# Patient Record
Sex: Male | Born: 1970
Health system: Southern US, Community
[De-identification: ages and names within clinical notes are randomized; demographics above are authoritative.]

## PROBLEM LIST (undated history)

## (undated) DIAGNOSIS — I10 Essential (primary) hypertension: Secondary | ICD-10-CM

## (undated) DIAGNOSIS — E669 Obesity, unspecified: Secondary | ICD-10-CM

## (undated) HISTORY — DX: Obesity, unspecified: E66.9

## (undated) HISTORY — PX: TONSILLECTOMY: SUR1361

## (undated) HISTORY — DX: Essential (primary) hypertension: I10

## (undated) HISTORY — PX: VASECTOMY: SHX75

## (undated) HISTORY — PX: ADENOIDECTOMY: SUR15

---

## 2002-10-16 ENCOUNTER — Encounter: Payer: Self-pay | Admitting: *Deleted

## 2002-10-16 ENCOUNTER — Encounter: Admission: RE | Admit: 2002-10-16 | Discharge: 2002-10-16 | Payer: Self-pay | Admitting: *Deleted

## 2005-12-12 ENCOUNTER — Emergency Department (HOSPITAL_COMMUNITY): Admission: EM | Admit: 2005-12-12 | Discharge: 2005-12-12 | Payer: Self-pay | Admitting: Family Medicine

## 2007-01-21 ENCOUNTER — Emergency Department (HOSPITAL_COMMUNITY): Admission: EM | Admit: 2007-01-21 | Discharge: 2007-01-21 | Payer: Self-pay | Admitting: Family Medicine

## 2007-02-18 ENCOUNTER — Encounter: Payer: Self-pay | Admitting: Family Medicine

## 2007-02-18 LAB — CONVERTED CEMR LAB
Cholesterol: 215 mg/dL
HDL: 47 mg/dL
LDL Cholesterol: 137 mg/dL
Triglycerides: 153 mg/dL

## 2007-02-24 ENCOUNTER — Ambulatory Visit: Payer: Self-pay | Admitting: Family Medicine

## 2007-02-24 DIAGNOSIS — I8390 Asymptomatic varicose veins of unspecified lower extremity: Secondary | ICD-10-CM

## 2007-02-24 DIAGNOSIS — I10 Essential (primary) hypertension: Secondary | ICD-10-CM

## 2007-04-24 ENCOUNTER — Encounter: Payer: Self-pay | Admitting: Family Medicine

## 2007-04-24 ENCOUNTER — Telehealth (INDEPENDENT_AMBULATORY_CARE_PROVIDER_SITE_OTHER): Payer: Self-pay | Admitting: *Deleted

## 2007-04-24 LAB — CONVERTED CEMR LAB
BUN: 14 mg/dL (ref 6–23)
CO2: 25 meq/L (ref 19–32)
Calcium: 9.5 mg/dL (ref 8.4–10.5)
Chloride: 103 meq/L (ref 96–112)
Creatinine, Ser: 1.23 mg/dL (ref 0.40–1.50)
Glucose, Bld: 89 mg/dL (ref 70–99)
Potassium: 4.9 meq/L (ref 3.5–5.3)
Sodium: 140 meq/L (ref 135–145)

## 2008-09-06 ENCOUNTER — Ambulatory Visit: Payer: Self-pay | Admitting: Family Medicine

## 2008-11-10 ENCOUNTER — Ambulatory Visit: Payer: Self-pay | Admitting: Family Medicine

## 2008-11-10 DIAGNOSIS — H9209 Otalgia, unspecified ear: Secondary | ICD-10-CM | POA: Insufficient documentation

## 2008-11-10 DIAGNOSIS — J069 Acute upper respiratory infection, unspecified: Secondary | ICD-10-CM | POA: Insufficient documentation

## 2009-05-18 ENCOUNTER — Telehealth (INDEPENDENT_AMBULATORY_CARE_PROVIDER_SITE_OTHER): Payer: Self-pay | Admitting: *Deleted

## 2009-08-29 ENCOUNTER — Telehealth (INDEPENDENT_AMBULATORY_CARE_PROVIDER_SITE_OTHER): Payer: Self-pay | Admitting: *Deleted

## 2010-03-16 ENCOUNTER — Ambulatory Visit: Payer: Self-pay | Admitting: Family Medicine

## 2010-05-19 ENCOUNTER — Encounter: Payer: Self-pay | Admitting: Family Medicine

## 2010-05-22 ENCOUNTER — Ambulatory Visit: Payer: Self-pay | Admitting: Family Medicine

## 2010-05-22 LAB — CONVERTED CEMR LAB
ALT: 21 units/L (ref 0–53)
AST: 18 units/L (ref 0–37)
Albumin: 4.3 g/dL (ref 3.5–5.2)
Alkaline Phosphatase: 66 units/L (ref 39–117)
BUN: 16 mg/dL (ref 6–23)
CO2: 25 meq/L (ref 19–32)
Calcium: 9.4 mg/dL (ref 8.4–10.5)
Chloride: 102 meq/L (ref 96–112)
Cholesterol: 205 mg/dL — ABNORMAL HIGH (ref 0–200)
Creatinine, Ser: 1.25 mg/dL (ref 0.40–1.50)
Glucose, Bld: 91 mg/dL (ref 70–99)
HDL: 55 mg/dL (ref >39)
LDL Cholesterol: 125 mg/dL — ABNORMAL HIGH (ref 0–99)
Potassium: 4.4 meq/L (ref 3.5–5.3)
Sodium: 138 meq/L (ref 135–145)
Total Bilirubin: 0.4 mg/dL (ref 0.3–1.2)
Total CHOL/HDL Ratio: 3.7
Total Protein: 7.3 g/dL (ref 6.0–8.3)
Triglycerides: 125 mg/dL (ref <150)
VLDL: 25 mg/dL (ref 0–40)

## 2010-06-15 ENCOUNTER — Telehealth: Payer: Self-pay | Admitting: Family Medicine

## 2010-10-30 ENCOUNTER — Ambulatory Visit: Payer: Self-pay | Admitting: Family Medicine

## 2010-12-26 NOTE — Assessment & Plan Note (Signed)
Summary: CPE   Vital Signs:  Patient profile:   40 year old male Height:      69.75 inches Weight:      216 pounds BMI:     31.33 O2 Sat:      99 % on Room air Pulse rate:   62 / minute BP sitting:   131 / 78  (left arm) Cuff size:   large  Vitals Entered By: Payton Spark CMA (October 30, 2010 8:47 AM)  O2 Flow:  Room air CC: CPE   Primary Care Provider:  Seymour Bars DO  CC:  CPE.  History of Present Illness: 40 yo WM presents for CPE.  He had fasting labs in June 2011.  Jogging for exercise but just not on a regular basis.  His tetanus was updated in 2006.  Denies fam hx of colon cancer and is unsure of a fam hx for prostate cancer.  Denies any rectal bleeding or problems voiding.  He had a flu shot at work.  Married with 2 kids.  Denies fam hx of premature heart dz.  He take as MVI daily, wears compression hose for varicose veins at work.   Current Medications (verified): 1)  Multivitamins  Tabs (Multiple Vitamin) .... Take 1 Tablet By Mouth Once A Day 2)  Ranitidine Hcl 150 Mg Caps (Ranitidine Hcl) .Marland Kitchen.. 1 Tab By Mouth Two Times A Day As Needed Heartburn 3)  Benazepril-Hydrochlorothiazide 10-12.5 Mg Tabs (Benazepril-Hydrochlorothiazide) .Marland Kitchen.. 1 Tab By Mouth Qam  Allergies (verified): No Known Drug Allergies  Past History:  Past Medical History: Reviewed history from 05/22/2010 and no changes required. HTN obesity  Past Surgical History: Reviewed history from 09/06/2008 and no changes required. adenoidectomy vasectomy  Family History: Reviewed history from 02/24/2007 and no changes required. father high chol, HTN, ETOH, depression mother depression, ETOH PGF AMI at 71, died MGF, prostate or bladder CA  Social History: Reviewed history from 02/24/2007 and no changes required. 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  Married to [place].  Has 2 young kids.  Nonsmoker.  1-2 ETOH drinks per day.  2 cups coffee daily.  Trying to lose wt.  Fair diet, cardio and wts 3 x a wk.  Review  of Systems  The patient denies anorexia, fever, weight loss, weight gain, vision loss, decreased hearing, hoarseness, chest pain, syncope, dyspnea on exertion, peripheral edema, prolonged cough, headaches, hemoptysis, abdominal pain, melena, hematochezia, severe indigestion/heartburn, hematuria, incontinence, genital sores, muscle weakness, suspicious skin lesions, transient blindness, difficulty walking, depression, unusual weight change, abnormal bleeding, enlarged lymph nodes, angioedema, breast masses, and testicular masses.    Physical Exam  General:  alert, well-developed, well-nourished, well-hydrated, and overweight-appearing.   Head:  normocephalic, atraumatic, and male-pattern balding.   Eyes:  pupils equal, pupils round, and pupils reactive to light.   Ears:  no external deformities.   Nose:  no nasal discharge.   Mouth:  good dentition and pharynx pink and moist.   Neck:  no masses.   Lungs:  Normal respiratory effort, chest expands symmetrically. Lungs are clear to auscultation, no crackles or wheezes. Heart:  Normal rate and regular rhythm. S1 and S2 normal without gallop, murmur, click, rub or other extra sounds. Abdomen:  Bowel sounds positive,abdomen soft and non-tender without masses, organomegaly, no AA bruits, NABS Pulses:  2+ radial and pedal pulses Extremities:  no UE or LE edema small proximal L thumbnail hematoma mild LE varicose veins Neurologic:  gait normal.   Skin:  color normal.  Cervical Nodes:  No lymphadenopathy noted Psych:  good eye contact, not anxious appearing, and not depressed appearing.     Impression & Recommendations:  Problem # 1:  HEALTH MAINTENANCE EXAM (ICD-V70.0) Keeping healthy checklist for men reviewed. BP OK. Tetanus updated in 2006. Labs done 05-19-10, normal. Flu shot done at work. Work on [employer], regular exercise, wt loss, MVI daily. RTC in 6 mos.  Complete Medication List: 1)  Multivitamins Tabs (Multiple vitamin) ....  Take 1 tablet by mouth once a day 2)  Ranitidine Hcl 150 Mg Caps (Ranitidine hcl) .Marland Kitchen.. 1 tab by mouth two times a day as needed heartburn 3)  Benazepril-hydrochlorothiazide 10-12.5 Mg Tabs (Benazepril-hydrochlorothiazide) .Marland Kitchen.. 1 tab by mouth qam  Patient Instructions: 1)  BP med RFd thru [facility] ([facility] 2)  Keep working on regular exercise, healthy diet and wt loss. 3)  REturn for follow up BP with fasting labs in [place]. Prescriptions: BENAZEPRIL-HYDROCHLOROTHIAZIDE 10-12.5 MG TABS (BENAZEPRIL-HYDROCHLOROTHIAZIDE) 1 tab by mouth qAM  #90 x 3   Entered and Authorized by:   Seymour Bars DO   Signed by:   Seymour Bars DO on 10/30/2010   Method used:   Faxed to ...       [facility] (mail-order)             , [place]         Ph: [phone]       Fax: [phone]   RxID:   [id number]    Orders Added: 1)  Est. Patient age 63-39 [phone]

## 2010-12-26 NOTE — Assessment & Plan Note (Signed)
Summary: f/u HTN   Vital Signs:  Patient profile:   40 year old male Height:      69.75 inches Weight:      220 pounds BMI:     31.91 O2 Sat:      99 % on Room air Pulse rate:   81 / minute BP sitting:   137 / John  (left arm) Cuff size:   large  Vitals Entered By: Payton Spark CMA (May 22, 2010 10:23 AM)  O2 Flow:  Room air CC: F/U HTN   Primary Care Provider:  Seymour Bars DO  CC:  F/U HTN.  History of Present Illness: John Conway presents for f/u HTN.  He is taking Benazepril/HCTZ and his BPs at work have been 120s-130s over 80s.  Denies any problems.  Had labs drawn last month-- all normal.  Denies CP, DOE, palpitations or leg swelling.  He has lost 4 lbs since last visit.      Current Medications (verified): 1)  Multivitamins  Tabs (Multiple Vitamin) .... Take 1 Tablet By Mouth Once A Day 2)  Ranitidine Hcl 150 Mg Caps (Ranitidine Hcl) .Marland Kitchen.. 1 Tab By Mouth Two Times A Day As Needed Heartburn 3)  Benazepril-Hydrochlorothiazide 10-12.5 Mg Tabs (Benazepril-Hydrochlorothiazide) .Marland Kitchen.. 1 Tab By Mouth Qam  Allergies (verified): No Known Drug Allergies  Past History:  Past Medical History: HTN obesity  Past Surgical History: Reviewed history from 09/06/2008 and no changes required. adenoidectomy vasectomy  Social History: Reviewed history from 02/24/2007 and no changes required. 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  Nonsmoker.  1-2 ETOH drinks per day.  2 cups coffee daily.  Trying to lose wt.  Fair diet, cardio and wts 3 x a wk.  Review of Systems      See HPI  Physical Exam  General:  alert, well-developed, well-nourished, and well-hydrated.   Head:  normocephalic and atraumatic.   Eyes:  pupils equal, pupils round, and pupils reactive to light.   Mouth:  pharynx pink and moist.   Neck:  no masses.   Lungs:  Normal respiratory effort, chest expands symmetrically. Lungs are clear to auscultation, no crackles or wheezes. Heart:  Normal rate  and regular rhythm. S1 and S2 normal without gallop, murmur, click, rub or other extra sounds. Extremities:  no LE edema Skin:  color normal.   Psych:  good eye contact, not anxious appearing, and not depressed appearing.     Impression & Recommendations:  Problem # 1:  HYPERTENSION, BENIGN ESSENTIAL (ICD-401.1) Assessment Improved At goal of <140/90.  Continue current meds.  Reviewed CMP/ FLP from May, looks good.  Work on diet, exercise, wt loss.  RTC in 6 mos. His updated medication list for this problem includes:    Benazepril-hydrochlorothiazide 10-12.5 Mg Tabs (Benazepril-hydrochlorothiazide) .Marland Kitchen... 1 tab by mouth qam  BP today: 137/John Prior BP: 148/94 (03/16/2010)  Prior 10 Yr Risk Heart Disease: 4 % (09/06/2008)  Labs Reviewed: K+: 4.4 (05/19/2010) Creat: : 1.25 (05/19/2010)   Chol: 205 (05/19/2010)   HDL: 55 (05/19/2010)   LDL: 125 (05/19/2010)   TG: 125 (05/19/2010)  Complete Medication List: 1)  Multivitamins Tabs (Multiple vitamin) .... Take 1 tablet by mouth once a day 2)  Ranitidine Hcl 150 Mg Caps (Ranitidine hcl) .Marland Kitchen.. 1 tab by mouth two times a day as needed heartburn 3)  Benazepril-hydrochlorothiazide 10-12.5 Mg Tabs (Benazepril-hydrochlorothiazide) .Marland Kitchen.. 1 tab by mouth qam  Patient Instructions: 1)  Return for a  PHYSICAL in 6 mos. Prescriptions: BENAZEPRIL-HYDROCHLOROTHIAZIDE 10-12.5 MG TABS (BENAZEPRIL-HYDROCHLOROTHIAZIDE) 1 tab by mouth qAM  #30 x 6   Entered and Authorized by:   Seymour Bars DO   Signed by:   Seymour Bars DO on 05/22/2010   Method used:   Electronically to        [facility] [phone]* (retail)       [address]       [place], [place]  [address]       Ph: [phone] or [phone]       Fax: [phone]   RxID:   [id number]

## 2010-12-26 NOTE — Assessment & Plan Note (Signed)
Summary: f/u HTN   Vital Signs:  Patient profile:   40 year old male Height:      69.75 inches Weight:      224 pounds BMI:     32.49 O2 Sat:      98 % on Room air Pulse rate:   67 / minute BP sitting:   148 / 94  (left arm) Cuff size:   large  Vitals Entered By: Payton Spark CMA (March 16, 2010 10:51 AM)  O2 Flow:  Room air CC: F/U HTN   Primary Care Provider:  Seymour Bars DO  CC:  F/U HTN.  History of Present Illness: John Conway presents for f/u HTN.  He ran out of BP meds  3 mos ago and he is due for labs today.  Denies LE edema, CP, DOE.  He was doing fine on Benazepril in the past but 'got lazy' and stopped taking care of himself.  He is ready to get back on track with his BP and agrees to start eating healthier and exercising more.  He has gained 20 lbs in the past year.     Current Medications (verified): 1)  Multivitamins  Tabs (Multiple Vitamin) .... Take 1 Tablet By Mouth Once A Day 2)  Ranitidine Hcl 150 Mg Caps (Ranitidine Hcl) .Marland Kitchen.. 1 Tab By Mouth Two Times A Day As Needed Heartburn 3)  Lotensin 10 Mg Tabs (Benazepril Hcl) .Marland Kitchen.. 1 Tab By Mouth Daily  Allergies (verified): No Known Drug Allergies  Past History:  Past Medical History: Reviewed history from 09/06/2008 and no changes required. HTN  Past Surgical History: Reviewed history from 09/06/2008 and no changes required. adenoidectomy vasectomy  Social History: Reviewed history from 02/24/2007 and no changes required. 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  Has 2 young kids.  Nonsmoker.  1-2 ETOH drinks per day.  2 cups coffee daily.  Trying to lose wt.  Fair diet, cardio and wts 3 x a wk.  Review of Systems      See HPI  Physical Exam  General:  alert, well-developed, well-nourished, and well-hydrated.  obese Head:  normocephalic and atraumatic.   Eyes:  pupils equal, pupils round, and pupils reactive to light.   Nose:  no nasal discharge.   Mouth:  pharynx pink and moist.   Neck:  no  masses.   Lungs:  Normal respiratory effort, chest expands symmetrically. Lungs are clear to auscultation, no crackles or wheezes. Heart:  Normal rate and regular rhythm. S1 and S2 normal without gallop, murmur, click, rub or other extra sounds. Extremities:  no LE edema Skin:  color normal.   Psych:  good eye contact, not anxious appearing, and not depressed appearing.     Impression & Recommendations:  Problem # 1:  HYPERTENSION, BENIGN ESSENTIAL (ICD-401.1) Assessment Deteriorated BP high off meds.  Restart, adding HCTZ onto Benazepril.  Update labs.  Work on [employer], regular exercise, wt loss.  F/U in 2 mos to see how he is doing with BP and lifestyle changes. His updated medication list for this problem includes:    Benazepril-hydrochlorothiazide 10-12.5 Mg Tabs (Benazepril-hydrochlorothiazide) .Marland Kitchen... 1 tab by mouth qam  Orders: T-Comprehensive Metabolic Panel ([id number])  BP today: 148/94 Prior BP: 132/73 (11/10/2008)  Prior 10 Yr Risk Heart Disease: 4 % (09/06/2008)  Labs Reviewed: K+: 4.9 (04/24/2007) Creat: : 1.23 (04/24/2007)   Chol: 215 (02/18/2007)   HDL: 47 (02/18/2007)   LDL: 137 (02/18/2007)   TG: 153 (02/18/2007)  Complete Medication List: 1)  Multivitamins Tabs (Multiple vitamin) .... Take 1 tablet by mouth once a day 2)  Ranitidine Hcl 150 Mg Caps (Ranitidine hcl) .Marland Kitchen.. 1 tab by mouth two times a day as needed heartburn 3)  Benazepril-hydrochlorothiazide 10-12.5 Mg Tabs (Benazepril-hydrochlorothiazide) .Marland Kitchen.. 1 tab by mouth qam  Other Orders: T-Lipid Profile ([id number])  Patient Instructions: 1)  Start Benazepril/HCTZ for high BP. 2)  Work on [employer] and 1 hr of exercise 4-5 days/ wk. 3)  Limit meals out, processed foods.  Limit red meat to 1 serving/ week. 4)  Will call you with fasting lab results. 5)  Return for F/U OV in 2 months. Prescriptions: BENAZEPRIL-HYDROCHLOROTHIAZIDE 10-12.5 MG TABS (BENAZEPRIL-HYDROCHLOROTHIAZIDE) 1 tab by mouth qAM   #30 x 3   Entered and Authorized by:   Seymour Bars DO   Signed by:   Seymour Bars DO on 03/16/2010   Method used:   [facility] to        [facility] [phone]* (retail)       [address]       [place], [place]  [address]       Ph: [phone] or [phone]       Fax: [phone]   RxID:   [id number]

## 2010-12-26 NOTE — Progress Notes (Signed)
Summary: [facility] refill--Benazepril-hctz  Phone Note Refill Request Message from:  Fax from [facility] on June 15, 2010 1:42 PM  Refills Requested: Medication #1:  BENAZEPRIL-HYDROCHLOROTHIAZIDE 10-12.5 MG TABS 1 tab by mouth qAM.   Dosage confirmed as above?Dosage Confirmed   Supply Requested: 3 months   Last Refilled: 05/22/2010 Next Appointment Scheduled: 10/30/10  Dr Cathey Endow Initial call taken by: Mervin Kung CMA Duncan Dull),  June 15, 2010 1:43 PM    Prescriptions: BENAZEPRIL-HYDROCHLOROTHIAZIDE 10-12.5 MG TABS (BENAZEPRIL-HYDROCHLOROTHIAZIDE) 1 tab by mouth qAM  #90 x 0   Entered by:   Mervin Kung CMA (AAMA)   Authorized by:   Seymour Bars DO   Signed by:   Mervin Kung CMA (AAMA) on 06/15/2010   Method used:   Faxed to ...       [facility]* (retail)             ,          Ph: [phone]       Fax: [phone]   RxID:   [id number]

## 2011-05-02 ENCOUNTER — Other Ambulatory Visit: Payer: Self-pay | Admitting: Family Medicine

## 2011-06-02 ENCOUNTER — Encounter: Payer: Self-pay | Admitting: Family Medicine

## 2011-06-04 ENCOUNTER — Encounter: Payer: Self-pay | Admitting: Family Medicine

## 2011-06-04 ENCOUNTER — Ambulatory Visit (INDEPENDENT_AMBULATORY_CARE_PROVIDER_SITE_OTHER): Payer: 59 | Admitting: Family Medicine

## 2011-06-04 DIAGNOSIS — Z13220 Encounter for screening for lipoid disorders: Secondary | ICD-10-CM

## 2011-06-04 DIAGNOSIS — I10 Essential (primary) hypertension: Secondary | ICD-10-CM

## 2011-06-04 MED ORDER — BENAZEPRIL-HYDROCHLOROTHIAZIDE 10-12.5 MG PO TABS: 1.0000 | ORAL_TABLET | Freq: Every day | ORAL | Status: DC

## 2011-06-04 NOTE — Progress Notes (Signed)
  Subjective:    Patient ID: John Conway, male    DOB: 05/29/1971, 40 y.o.   MRN: [id number]  HPI  40 yo WM presents for f/u HTN.  Doing well on Benazepril/ HCTZ.  Checking BPs at work and he's running 130s/ 80s.  He is tolerating  meds well.  Now working day shift.  Denies chest pain or DOE.  No edema.  Exercising well.  Eats healthy.  BP 142/86  Pulse 73  Ht 5\' 10"  (1.778 m)  Wt 216 lb (97.977 kg)  BMI 30.99 kg/m2  SpO2 95%    Review of Systems  Constitutional: Negative for fatigue and unexpected weight change.  Eyes: Negative for visual disturbance.  Respiratory: Negative for shortness of breath.   Cardiovascular: Negative for chest pain, palpitations and leg swelling.  Neurological: Negative for headaches.  Psychiatric/Behavioral: Negative for dysphoric mood.       Objective:   Physical Exam  Constitutional: He appears well-developed and well-nourished. No distress.       overwt  HENT:  Mouth/Throat: Oropharynx is clear and moist.  Eyes: Conjunctivae are normal.  Neck: Neck supple.  Cardiovascular: Normal rate and regular rhythm.   No murmur heard. Pulmonary/Chest: Effort normal and breath sounds normal.  Musculoskeletal: He exhibits no edema.       Varicose veins  Lymphadenopathy:    He has no cervical adenopathy.  Skin: Skin is warm and dry.  Psychiatric: He has a normal mood and affect.          Assessment & Plan:

## 2011-06-04 NOTE — Assessment & Plan Note (Signed)
BP a little high here but at goal at home/ work.  Continue current meds.  RFd.  Update labs and call if running >140/90.  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    Work on [employer], regular exercise, wt loss.

## 2011-06-04 NOTE — Patient Instructions (Addendum)
Update fasting labs one morning. Will call you w/ results.  BP medicine RFd. Work on [employer], regular exercise.  Call me if BPs are consistently running > 140/90.  Return for f/u in 6 mos.

## 2011-12-04 ENCOUNTER — Other Ambulatory Visit: Payer: Self-pay | Admitting: *Deleted

## 2011-12-04 MED ORDER — BENAZEPRIL-HYDROCHLOROTHIAZIDE 10-12.5 MG PO TABS: 1.0000 | ORAL_TABLET | Freq: Every day | ORAL | Status: DC

## 2011-12-11 ENCOUNTER — Ambulatory Visit: Payer: 59 | Admitting: Family Medicine

## 2011-12-12 DIAGNOSIS — Z0289 Encounter for other administrative examinations: Secondary | ICD-10-CM

## 2012-06-05 ENCOUNTER — Other Ambulatory Visit: Payer: Self-pay | Admitting: *Deleted

## 2012-06-05 MED ORDER — BENAZEPRIL-HYDROCHLOROTHIAZIDE 10-12.5 MG PO TABS: 1.0000 | ORAL_TABLET | Freq: Every day | ORAL | Status: DC

## 2012-07-09 ENCOUNTER — Other Ambulatory Visit: Payer: Self-pay | Admitting: Family Medicine

## 2012-07-11 ENCOUNTER — Other Ambulatory Visit: Payer: Self-pay | Admitting: Family Medicine

## 2013-03-12 ENCOUNTER — Encounter: Payer: Self-pay | Admitting: Sports Medicine

## 2013-03-12 ENCOUNTER — Ambulatory Visit (INDEPENDENT_AMBULATORY_CARE_PROVIDER_SITE_OTHER): Payer: 59 | Admitting: Sports Medicine

## 2013-03-12 VITALS — BP 145/85 | HR 67 | Wt 226.0 lb

## 2013-03-12 DIAGNOSIS — K219 Gastro-esophageal reflux disease without esophagitis: Secondary | ICD-10-CM

## 2013-03-12 DIAGNOSIS — I10 Essential (primary) hypertension: Secondary | ICD-10-CM

## 2013-03-12 DIAGNOSIS — Z299 Encounter for prophylactic measures, unspecified: Secondary | ICD-10-CM

## 2013-03-12 DIAGNOSIS — E669 Obesity, unspecified: Secondary | ICD-10-CM | POA: Insufficient documentation

## 2013-03-12 DIAGNOSIS — Z0000 Encounter for general adult medical examination without abnormal findings: Secondary | ICD-10-CM | POA: Insufficient documentation

## 2013-03-12 MED ORDER — PANTOPRAZOLE SODIUM 40 MG PO TBEC: 40.0000 mg | DELAYED_RELEASE_TABLET | Freq: Every day | ORAL | Status: DC

## 2013-03-12 MED ORDER — LISINOPRIL 20 MG PO TABS: 20.0000 mg | ORAL_TABLET | Freq: Every day | ORAL | Status: DC

## 2013-03-12 NOTE — Assessment & Plan Note (Signed)
Checking blood work. Exercise prescription.

## 2013-03-12 NOTE — Assessment & Plan Note (Signed)
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 He should come back in a month for blood pressure check.

## 2013-03-12 NOTE — Patient Instructions (Signed)
Exercise prescription:  You should adjust the intensity of your exercise based on your heart rate. The [employer] College sports medicine recommends keeping your heart rate between 70-80% of its maximum for 30 minutes, 3-5 times per week. Maximum heart rate = (220 - age). Multiply this number by 0.75 to get your goal heart rate. If lower, then increase the intensity of your exercise. If the number is higher, you may decrease the intensity of your exercise. 

## 2013-03-12 NOTE — Assessment & Plan Note (Signed)
Nutritionist followup.

## 2013-03-12 NOTE — Progress Notes (Signed)
  Subjective:    CC: Establish care.   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   Hypertension: Was taking enalapril/hydrochlorothiazide, has since stopped.  Obesity: Would like to get back into exercising.  GERD: Was moderately well controlled with Zantac.   Past medical history, Surgical history, Family history not pertinant except as noted below, Social history, Allergies, and medications have been entered into the medical record, reviewed, and no changes needed.   Review of Systems: No headache, visual changes, nausea, vomiting, diarrhea, constipation, dizziness, abdominal pain, skin rash, fevers, chills, night sweats, swollen lymph nodes, weight loss, chest pain, body aches, joint swelling, muscle aches, shortness of breath, mood changes, visual or auditory hallucinations.  Objective:    General: Well Developed, well nourished, and in no acute distress.  Neuro: Alert and oriented x3, extra-ocular muscles intact, sensation grossly intact.  HEENT: Normocephalic, atraumatic, pupils equal round reactive to light, neck supple, no masses, no lymphadenopathy, thyroid nonpalpable.  Skin: Warm and dry, no rashes noted.  Cardiac: Regular rate and rhythm, no murmurs rubs or gallops.  Respiratory: Clear to auscultation bilaterally. Not using accessory muscles, speaking in full sentences.  Abdominal: Soft, nontender, nondistended, positive bowel sounds, no masses, no organomegaly.  Musculoskeletal: Shoulder, elbow, wrist, hip, knee, ankle stable, and with full range of motion. Impression and Recommendations:    The patient was counselled, risk factors were discussed, anticipatory guidance given.

## 2013-03-12 NOTE — Assessment & Plan Note (Signed)
Adding protonix.

## 2013-04-13 ENCOUNTER — Ambulatory Visit: Payer: 59 | Admitting: Sports Medicine

## 2013-04-15 ENCOUNTER — Ambulatory Visit: Payer: 59 | Admitting: Sports Medicine

## 2013-04-22 ENCOUNTER — Encounter: Payer: Self-pay | Admitting: Sports Medicine

## 2013-04-22 ENCOUNTER — Ambulatory Visit (INDEPENDENT_AMBULATORY_CARE_PROVIDER_SITE_OTHER): Payer: 59 | Admitting: Sports Medicine

## 2013-04-22 ENCOUNTER — Ambulatory Visit: Payer: 59 | Admitting: *Deleted

## 2013-04-22 VITALS — BP 137/87 | HR 69

## 2013-04-22 DIAGNOSIS — I10 Essential (primary) hypertension: Secondary | ICD-10-CM

## 2013-04-22 MED ORDER — LISINOPRIL 40 MG PO TABS: 40.0000 mg | ORAL_TABLET | Freq: Every day | ORAL | Status: DC

## 2013-04-22 NOTE — Assessment & Plan Note (Signed)
Much improved. Increasing lisinopril to 40 mg daily. Return in one month. Still awaiting blood work.

## 2013-04-22 NOTE — Progress Notes (Signed)
  Subjective:    CC: Followup  HPI: Hypertension: Much improved with starting lisinopril, no side effects. Happy with results so far.  Past medical history, Surgical history, Family history not pertinant except as noted below, Social history, Allergies, and medications have been entered into the medical record, reviewed, and no changes needed.   Review of Systems: No fevers, chills, night sweats, weight loss, chest pain, or shortness of breath.   Objective:    General: Well Developed, well nourished, and in no acute distress.  Neuro: Alert and oriented x3, extra-ocular muscles intact, sensation grossly intact.  HEENT: Normocephalic, atraumatic, pupils equal round reactive to light, neck supple, no masses, no lymphadenopathy, thyroid nonpalpable.  Skin: Warm and dry, no rashes. Cardiac: Regular rate and rhythm, no murmurs rubs or gallops, no lower extremity edema.  Respiratory: Clear to auscultation bilaterally. Not using accessory muscles, speaking in full sentences.  Impression and Recommendations:    I spent 25 minutes with this patient, greater than 50% was face-to-face time counseling regarding hypertension.

## 2013-05-20 ENCOUNTER — Ambulatory Visit: Payer: 59 | Admitting: Sports Medicine

## 2013-05-20 DIAGNOSIS — Z0289 Encounter for other administrative examinations: Secondary | ICD-10-CM

## 2013-06-09 ENCOUNTER — Ambulatory Visit: Payer: 59 | Admitting: Dietician

## 2014-04-21 ENCOUNTER — Emergency Department
Admission: EM | Admit: 2014-04-21 | Discharge: 2014-04-21 | Disposition: A | Discharge status: Home / Self Care | Payer: 59 | Source: Home / Self Care | Attending: Family Medicine | Admitting: Family Medicine

## 2014-04-21 ENCOUNTER — Encounter: Payer: Self-pay | Admitting: Emergency Medicine

## 2014-04-21 DIAGNOSIS — B356 Tinea cruris: Secondary | ICD-10-CM

## 2014-04-21 MED ORDER — KETOCONAZOLE 2 % EX CREA: 1.0000 "application " | TOPICAL_CREAM | Freq: Every day | CUTANEOUS | Status: DC

## 2014-04-21 NOTE — ED Provider Notes (Signed)
CSN: [id number]     Arrival date & time 04/21/14  0848 History   First MD Initiated Contact with Patient 04/21/14 [phone]     Chief Complaint  Patient presents with  . Pruritis    "jock itch"     HPI Comments: Patient complains of one month history of itching of his scrotum and bilateral inguinal areas without definite rash.  He has tried OTC miconazole cream, Lotrimin cream, and Gold Bond powder. He admits that he takes two showers daily, uses [place] soap, and gets "squeeky clean."  Patient is a 43 y.o. male presenting with rash. The history is provided by the patient.  Rash Pain location: scrotum. Pain quality comment:  Itching Pain severity:  Mild Onset quality:  Gradual Duration:  4 weeks Timing:  Constant Progression:  Unchanged Chronicity:  New Relieved by:  Nothing Worsened by:  Nothing tried Ineffective treatments:  OTC medications Associated symptoms: no chills, no dysuria, no fatigue, no fever and no nausea     Past Medical History  Diagnosis Date  . Hypertension   . Obesity    Past Surgical History  Procedure Laterality Date  . Adenoidectomy    . Vasectomy    . Tonsillectomy     Family History  Problem Relation Age of Onset  . Depression Mother   . Alcohol abuse Mother   . Hyperlipidemia Father   . Hypertension Father   . Alcohol abuse Father   . Depression Father   . Heart attack Paternal Grandfather 43  . Cancer Maternal Grandfather     prostate and bladder CA    History  Substance Use Topics  . Smoking status: Never Smoker   . Smokeless tobacco: Current User    Types: Chew  . Alcohol Use: 1.0 oz/week    2 drink(s) per week     Comment: daily    Review of Systems  Constitutional: Negative for fever, chills and fatigue.  Gastrointestinal: Negative for nausea.  Genitourinary: Negative for dysuria.  Skin: Positive for rash. Negative for color change.    Allergies  Review of patient's allergies indicates no known allergies.  Home  Medications   Prior to Admission medications   Medication Sig Start Date End Date Taking? Authorizing Provider  lisinopril (PRINIVIL,ZESTRIL) 40 MG tablet Take 1 tablet (40 mg total) by mouth daily. 04/22/13   Monica Becton, MD  Multiple Vitamin (MULTIVITAMIN) tablet Take 1 tablet by mouth daily.      Historical Provider, MD  pantoprazole (PROTONIX) 40 MG tablet Take 1 tablet (40 mg total) by mouth daily. 03/12/13   Monica Becton, MD   BP 136/78  Pulse 73  Temp(Src) 98.1 F (36.7 C) (Oral)  Resp 14  Ht 5\' 10"  (1.778 m)  Wt 224 lb (101.606 kg)  BMI 32.14 kg/m2  SpO2 98% Physical Exam  Constitutional: He is oriented to person, place, and time. He appears well-developed and well-nourished. No distress.  HENT:  Head: Atraumatic.  Eyes: Conjunctivae are normal. Pupils are equal, round, and reactive to light.  Genitourinary:     Scrotum, penis, inguinal region and surrounding area appear normal.  However, under Wood's lamp there is no fluorescence, but faint erythematous outline is present  as noted on diagram.    Neurological: He is alert and oriented to person, place, and time.  Skin: Skin is warm and dry.    ED Course  Procedures  none      MDM   1. Tinea cruris; suspect dry  skin as well.    Rx for Nizoral cream Minimize baths/showers.  Use a mild bath soap containing oil such as unscented Dove.  Apply a moisturizing cream or lotion immediately after bathing while still wet, then towel dry.  May apply thin film of 1% hydrocortisone cream twice daily for about 5 to 7 days. Followup with dermatologist if not improved two weeks.    Lattie HawStephen A Tyaisha Cullom, MD 04/21/14 1048

## 2014-04-21 NOTE — ED Notes (Signed)
John Conway c/o scrotal itching x 4 weeks. Used Miconazole cream and lotrimin without relief. Itching is without blisters and has not spread.

## 2014-04-21 NOTE — Discharge Instructions (Signed)
Minimize baths/showers.  Use a mild bath soap containing oil such as unscented Dove.  Apply a moisturizing cream or lotion immediately after bathing while still wet, then towel dry.  May apply thin film of 1% hydrocortisone cream twice daily for about 5 to 7 days.   Jock Itch Jock itch is a fungal infection of the skin in the groin area. It is sometimes called "ringworm" even though it is not caused by a worm. A fungus is a type of germ that thrives in dark, damp places.  CAUSES  This infection may spread from:  A fungus infection elsewhere on the body (such as athlete's foot).  Sharing towels or clothing. This infection is more common in:  Hot, humid climates.  People who wear tight-fitting clothing or wet bathing suits for long periods of time.  Athletes.  Overweight people.  People with diabetes. SYMPTOMS  Jock itch causes the following symptoms:  Red, pink or brown rash in the groin. Rash may spread to the thighs, anus, and buttocks.  Itching. DIAGNOSIS  Your caregiver may make the diagnosis by looking at the rash. Sometimes a skin scraping will be sent to test for fungus. Testing can be done either by looking under the microscope or by doing a culture (test to try to grow the fungus). A culture can take up to 2 weeks to come back. TREATMENT  Jock itch may be treated with:  Skin cream or ointment to kill fungus.  Medicine by mouth to kill fungus.  Skin cream or ointment to calm the itching.  Compresses or medicated powders to dry the infected skin. HOME CARE INSTRUCTIONS   Be sure to treat the rash completely. Follow your caregiver's instructions. It can take a couple of weeks to treat. If you do not treat the infection long enough, the rash can come back.  Wear loose-fitting clothing.  Men should wear cotton boxer shorts.  Women should wear cotton underwear.  Avoid hot baths.  Dry the groin area well after bathing. SEEK MEDICAL CARE IF:   Your rash is  worse.  Your rash is spreading.  Your rash returns after treatment is finished.  Your rash is not gone in 4 weeks. Fungal infections are slow to respond to treatment. Some redness may remain for several weeks after the fungus is gone. SEEK IMMEDIATE MEDICAL CARE IF:  The area becomes red, warm, tender, and swollen.  You have a fever. Document Released: 11/02/2002 Document Revised: 02/04/2012 Document Reviewed: 10/01/2008 [facility] Patient Information 2014 [place], [place].

## 2014-08-12 ENCOUNTER — Ambulatory Visit (INDEPENDENT_AMBULATORY_CARE_PROVIDER_SITE_OTHER): Payer: 59 | Admitting: Sports Medicine

## 2014-08-12 ENCOUNTER — Encounter: Payer: Self-pay | Admitting: Sports Medicine

## 2014-08-12 VITALS — BP 152/92 | HR 71 | Ht 70.0 in | Wt 224.0 lb

## 2014-08-12 DIAGNOSIS — K2100 Gastro-esophageal reflux disease with esophagitis, without bleeding: Secondary | ICD-10-CM

## 2014-08-12 DIAGNOSIS — Z23 Encounter for immunization: Secondary | ICD-10-CM

## 2014-08-12 DIAGNOSIS — I10 Essential (primary) hypertension: Secondary | ICD-10-CM

## 2014-08-12 DIAGNOSIS — E669 Obesity, unspecified: Secondary | ICD-10-CM

## 2014-08-12 DIAGNOSIS — Z0000 Encounter for general adult medical examination without abnormal findings: Secondary | ICD-10-CM

## 2014-08-12 MED ORDER — AMBULATORY NON FORMULARY MEDICATION: Status: AC

## 2014-08-12 MED ORDER — LISINOPRIL 40 MG PO TABS: 40.0000 mg | ORAL_TABLET | Freq: Every day | ORAL | Status: DC

## 2014-08-12 MED ORDER — PHENTERMINE HCL 37.5 MG PO CAPS: ORAL_CAPSULE | ORAL | Status: DC

## 2014-08-12 MED ORDER — PANTOPRAZOLE SODIUM 40 MG PO TBEC
40.0000 mg | DELAYED_RELEASE_TABLET | Freq: Every day | ORAL | Status: DC
Start: 2014-08-12 — End: 2015-08-08

## 2014-08-12 NOTE — Assessment & Plan Note (Signed)
Starting phentermine, he works second shift so he will take the medication in approximately 2 to 3:00 PM. Return monthly for weight checks and refills.

## 2014-08-12 NOTE — Assessment & Plan Note (Signed)
Refilling pantoprazole.

## 2014-08-12 NOTE — Assessment & Plan Note (Signed)
Normal physical exam, he is off of blood pressure medication. Checking Quantiferon Gold, giving influenza vaccine.

## 2014-08-12 NOTE — Progress Notes (Signed)
  Subjective:    CC: CPE  HPI:  Preventative measures: Cesare needs a complete physical, he also needs a Quantiferon Gold.  Hypertension: Has been out of medication for some time now. Blood pressure is elevated, no headaches, visual changes. No chest pain.  GERD: Needs refill on pantoprazole.  Obesity: Now desires to start weight loss medication.  Past medical history, Surgical history, Family history not pertinant except as noted below, Social history, Allergies, and medications have been entered into the medical record, reviewed, and no changes needed.   Review of Systems: No headache, visual changes, nausea, vomiting, diarrhea, constipation, dizziness, abdominal pain, skin rash, fevers, chills, night sweats, swollen lymph nodes, weight loss, chest pain, body aches, joint swelling, muscle aches, shortness of breath, mood changes, visual or auditory hallucinations.  Objective:    General: Well Developed, well nourished, and in no acute distress.  Neuro: Alert and oriented x3, extra-ocular muscles intact, sensation grossly intact. Cranial nerves II through XII are intact, motor, sensory, and coordinative functions are all intact. HEENT: Normocephalic, atraumatic, pupils equal round reactive to light, neck supple, no masses, no lymphadenopathy, thyroid nonpalpable. Oropharynx, nasopharynx, external ear canals are unremarkable. There is some left tympanic membrane otosclerosis. Skin: Warm and dry, no rashes noted.  Cardiac: Regular rate and rhythm, no murmurs rubs or gallops.  Respiratory: Clear to auscultation bilaterally. Not using accessory muscles, speaking in full sentences.  Abdominal: Soft, nontender, nondistended, positive bowel sounds, no masses, no organomegaly.  Musculoskeletal: Shoulder, elbow, wrist, hip, knee, ankle stable, and with full range of motion.  Impression and Recommendations:    The patient was counselled, risk factors were discussed, anticipatory guidance  given.

## 2014-08-12 NOTE — Assessment & Plan Note (Signed)
Ran out of blood pressure medicine, restarting lisinopril.

## 2014-08-15 LAB — QUANTIFERON TB GOLD ASSAY (BLOOD)
Interferon Gamma Release Assay: NEGATIVE
Mitogen value: 9.69 IU/mL
Quantiferon Nil Value: 0.06 IU/mL
Quantiferon Tb Ag Minus Nil Value: 0 IU/mL
TB Ag value: 0.06 IU/mL

## 2014-08-17 ENCOUNTER — Encounter: Payer: Self-pay | Admitting: Sports Medicine

## 2014-08-18 NOTE — Telephone Encounter (Signed)
Please see patient note, can you fax quantiferon gold results to the number patient has included?

## 2014-09-09 ENCOUNTER — Ambulatory Visit: Payer: 59 | Admitting: Sports Medicine

## 2014-09-15 ENCOUNTER — Encounter: Payer: Self-pay | Admitting: Sports Medicine

## 2014-09-15 ENCOUNTER — Ambulatory Visit (INDEPENDENT_AMBULATORY_CARE_PROVIDER_SITE_OTHER): Payer: 59 | Admitting: Sports Medicine

## 2014-09-15 VITALS — BP 130/76 | HR 77 | Wt 217.0 lb

## 2014-09-15 DIAGNOSIS — M25511 Pain in right shoulder: Secondary | ICD-10-CM

## 2014-09-15 DIAGNOSIS — E669 Obesity, unspecified: Secondary | ICD-10-CM

## 2014-09-15 DIAGNOSIS — Z0000 Encounter for general adult medical examination without abnormal findings: Secondary | ICD-10-CM

## 2014-09-15 MED ORDER — PHENTERMINE HCL 37.5 MG PO CAPS: ORAL_CAPSULE | ORAL | Status: DC

## 2014-09-15 MED ORDER — DOXYCYCLINE HYCLATE 100 MG PO TABS: 100.0000 mg | ORAL_TABLET | Freq: Two times a day (BID) | ORAL | Status: AC

## 2014-09-15 NOTE — Assessment & Plan Note (Signed)
There does appear to be a palpable nodule under the skin over the deltoid, musculoskeletal shoulder exam is completely unremarkable. Doxycycline. No better in a week we can consider excision in the office.

## 2014-09-15 NOTE — Assessment & Plan Note (Signed)
Patient will get his tetanus booster at the hospital. He does need hepatitis B titers for work.

## 2014-09-15 NOTE — Assessment & Plan Note (Signed)
Good weight loss, refilling phentermine. Return in one month. 

## 2014-09-15 NOTE — Progress Notes (Signed)
  Subjective:    CC: Weight check  HPI: Obesity: Good weight loss. No side effects, desires refill.  Right shoulder pain: Localize just distal to the acromion on the lateral aspect, it is not worse with any type of movement, or overhead activities. Mild, persistent, some palpable warmth. No radiation.  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  Past medical history, Surgical history, Family history not pertinant except as noted below, Social history, Allergies, and medications have been entered into the medical record, reviewed, and no changes needed.   Review of Systems: No fevers, chills, night sweats, weight loss, chest pain, or shortness of breath.   Objective:    General: Well Developed, well nourished, and in no acute distress.  Neuro: Alert and oriented x3, extra-ocular muscles intact, sensation grossly intact.  HEENT: Normocephalic, atraumatic, pupils equal round reactive to light, neck supple, no masses, no lymphadenopathy, thyroid nonpalpable.  Skin: Warm and dry, no rashes. Cardiac: Regular rate and rhythm, no murmurs rubs or gallops, no lower extremity edema.  Respiratory: Clear to auscultation bilaterally. Not using accessory muscles, speaking in full sentences. Right Shoulder: Inspection reveals no abnormalities, atrophy or asymmetry. Palpation is normal with no tenderness over AC joint or bicipital groove. ROM is full in all planes. Rotator cuff strength normal throughout. No signs of impingement with negative Neer and Hawkin's tests, empty can. Speeds and Yergason's tests normal. No labral pathology noted with negative Obrien's, negative crank, negative clunk, and good stability. Normal scapular function observed. No painful arc and no drop arm sign. No apprehension sign All special orthopedic examination is unremarkable, he does have a palpable area of tenderness just distal to the acromion laterally, with a  palpable nodule, approximately 1 cm across, movable, with the palpable appearance of a sebaceous cyst.  Impression and Recommendations:

## 2014-10-14 ENCOUNTER — Encounter: Payer: Self-pay | Admitting: Sports Medicine

## 2014-10-14 ENCOUNTER — Ambulatory Visit (INDEPENDENT_AMBULATORY_CARE_PROVIDER_SITE_OTHER): Payer: 59 | Admitting: Sports Medicine

## 2014-10-14 VITALS — BP 137/77 | HR 97 | Ht 70.0 in | Wt 215.0 lb

## 2014-10-14 DIAGNOSIS — E669 Obesity, unspecified: Secondary | ICD-10-CM

## 2014-10-14 DIAGNOSIS — M25511 Pain in right shoulder: Secondary | ICD-10-CM

## 2014-10-14 DIAGNOSIS — B079 Viral wart, unspecified: Secondary | ICD-10-CM

## 2014-10-14 MED ORDER — PHENTERMINE HCL 37.5 MG PO CAPS: ORAL_CAPSULE | ORAL | Status: DC

## 2014-10-14 NOTE — Assessment & Plan Note (Signed)
Minimal weight loss, refilling phentermine. Return in one month.

## 2014-10-14 NOTE — Progress Notes (Signed)
  Subjective:    CC: Follow-up  HPI: Obesity: 2 pound weight loss in the last month on phentermine.  Hypertension: Controlled, continue medications.  Wart: Right hand, 3 warts.  Past medical history, Surgical history, Family history not pertinant except as noted below, Social history, Allergies, and medications have been entered into the medical record, reviewed, and no changes needed.   Review of Systems: No fevers, chills, night sweats, weight loss, chest pain, or shortness of breath.   Objective:    General: Well Developed, well nourished, and in no acute distress.  Neuro: Alert and oriented x3, extra-ocular muscles intact, sensation grossly intact.  HEENT: Normocephalic, atraumatic, pupils equal round reactive to light, neck supple, no masses, no lymphadenopathy, thyroid nonpalpable.  Skin: Warm and dry, no rashes. There are 3 warts on the right hand Cardiac: Regular rate and rhythm, no murmurs rubs or gallops, no lower extremity edema.  Respiratory: Clear to auscultation bilaterally. Not using accessory muscles, speaking in full sentences.  Procedure:  Cryodestruction of 3 common warts on the right hand. Consent obtained and verified. Time-out conducted. Noted no overlying erythema, induration, or other signs of local infection. Completed without difficulty using Cryo-Gun. Advised to call if fevers/chills, erythema, induration, drainage, or persistent bleeding.  Impression and Recommendations:

## 2014-10-14 NOTE — Assessment & Plan Note (Signed)
Likely was an infected sebaceous cyst. Resolved with doxycycline.

## 2014-10-14 NOTE — Assessment & Plan Note (Signed)
Cryotherapy 3 on the right hand.

## 2014-11-09 ENCOUNTER — Ambulatory Visit: Payer: 59 | Admitting: Sports Medicine

## 2014-11-10 ENCOUNTER — Encounter: Payer: Self-pay | Admitting: Sports Medicine

## 2014-11-10 ENCOUNTER — Ambulatory Visit (INDEPENDENT_AMBULATORY_CARE_PROVIDER_SITE_OTHER): Payer: 59 | Admitting: Sports Medicine

## 2014-11-10 VITALS — BP 139/88 | HR 88 | Ht 70.0 in | Wt 209.0 lb

## 2014-11-10 DIAGNOSIS — E669 Obesity, unspecified: Secondary | ICD-10-CM

## 2014-11-10 MED ORDER — PHENTERMINE HCL 37.5 MG PO CAPS: ORAL_CAPSULE | ORAL | Status: DC

## 2014-11-10 NOTE — Assessment & Plan Note (Signed)
Additional 6 pound weight loss on phentermine. We did discuss other weight loss alternatives. He is going to think about Contrave and Belviq, and get back to me.

## 2014-11-10 NOTE — Patient Instructions (Signed)
Look into Contrave and Belviq.

## 2014-11-10 NOTE — Progress Notes (Signed)
  Subjective:    CC: Recheck weight  HPI: John Conway continues to have good weight loss. No side effects from the medication.  Past medical history, Surgical history, Family history not pertinant except as noted below, Social history, Allergies, and medications have been entered into the medical record, reviewed, and no changes needed.   Review of Systems: No fevers, chills, night sweats, weight loss, chest pain, or shortness of breath.   Objective:    General: Well Developed, well nourished, and in no acute distress.  Neuro: Alert and oriented x3, extra-ocular muscles intact, sensation grossly intact.  HEENT: Normocephalic, atraumatic, pupils equal round reactive to light, neck supple, no masses, no lymphadenopathy, thyroid nonpalpable.  Skin: Warm and dry, no rashes. Cardiac: Regular rate and rhythm, no murmurs rubs or gallops, no lower extremity edema.  Respiratory: Clear to auscultation bilaterally. Not using accessory muscles, speaking in full sentences.  Impression and Recommendations:

## 2014-12-08 ENCOUNTER — Ambulatory Visit: Payer: 59 | Admitting: Sports Medicine

## 2014-12-09 ENCOUNTER — Encounter: Payer: 59 | Admitting: Sports Medicine

## 2014-12-09 DIAGNOSIS — Z0289 Encounter for other administrative examinations: Secondary | ICD-10-CM

## 2014-12-24 NOTE — Progress Notes (Signed)
This encounter was created in error - please disregard.

## 2015-01-04 ENCOUNTER — Ambulatory Visit (INDEPENDENT_AMBULATORY_CARE_PROVIDER_SITE_OTHER): Payer: 59 | Admitting: Sports Medicine

## 2015-01-04 ENCOUNTER — Encounter: Payer: Self-pay | Admitting: Sports Medicine

## 2015-01-04 VITALS — BP 126/78 | HR 76 | Ht 70.0 in | Wt 211.0 lb

## 2015-01-04 DIAGNOSIS — E669 Obesity, unspecified: Secondary | ICD-10-CM

## 2015-01-04 DIAGNOSIS — I10 Essential (primary) hypertension: Secondary | ICD-10-CM

## 2015-01-04 MED ORDER — PHENTERMINE HCL 37.5 MG PO TABS: ORAL_TABLET | ORAL | Status: DC

## 2015-01-04 NOTE — Assessment & Plan Note (Signed)
Well controlled, no changes 

## 2015-01-04 NOTE — Progress Notes (Signed)
  Subjective:    CC: follow-up  HPI: Hypertension: Well controlled, no changes  Phentermine: Refilling medication, he understands that he has been discharged from the practice.  Past medical history, Surgical history, Family history not pertinant except as noted below, Social history, Allergies, and medications have been entered into the medical record, reviewed, and no changes needed.   Review of Systems: No fevers, chills, night sweats, weight loss, chest pain, or shortness of breath.   Objective:    General: Well Developed, well nourished, and in no acute distress.  Neuro: Alert and oriented x3, extra-ocular muscles intact, sensation grossly intact.  HEENT: Normocephalic, atraumatic, pupils equal round reactive to light, neck supple, no masses, no lymphadenopathy, thyroid nonpalpable.  Skin: Warm and dry, no rashes. Cardiac: Regular rate and rhythm, no murmurs rubs or gallops, no lower extremity edema.  Respiratory: Clear to auscultation bilaterally. Not using accessory muscles, speaking in full sentences.  Impression and Recommendations:

## 2015-01-04 NOTE — Assessment & Plan Note (Signed)
Refilling phentermine for an additional month at the full dose and then 6 months at a half tab.

## 2015-01-11 ENCOUNTER — Encounter: Payer: Self-pay | Admitting: Sports Medicine

## 2015-08-08 ENCOUNTER — Telehealth: Payer: Self-pay | Admitting: Sports Medicine

## 2015-08-08 DIAGNOSIS — I10 Essential (primary) hypertension: Secondary | ICD-10-CM

## 2015-08-08 DIAGNOSIS — K2100 Gastro-esophageal reflux disease with esophagitis, without bleeding: Secondary | ICD-10-CM

## 2015-08-08 MED ORDER — LISINOPRIL 40 MG PO TABS: 40.0000 mg | ORAL_TABLET | Freq: Every day | ORAL | Status: DC

## 2015-08-08 MED ORDER — PANTOPRAZOLE SODIUM 40 MG PO TBEC: 40.0000 mg | DELAYED_RELEASE_TABLET | Freq: Every day | ORAL | Status: DC

## 2015-08-08 NOTE — Telephone Encounter (Signed)
Received a refill request, left voicemail for Pt informing he was due of follow up appointment. Callback information provided.

## 2015-08-15 ENCOUNTER — Encounter: Payer: Self-pay | Admitting: Sports Medicine

## 2015-08-15 ENCOUNTER — Ambulatory Visit (INDEPENDENT_AMBULATORY_CARE_PROVIDER_SITE_OTHER): Payer: 59 | Admitting: Sports Medicine

## 2015-08-15 VITALS — BP 139/89 | HR 72 | Ht 70.0 in | Wt 222.0 lb

## 2015-08-15 DIAGNOSIS — K2100 Gastro-esophageal reflux disease with esophagitis, without bleeding: Secondary | ICD-10-CM

## 2015-08-15 DIAGNOSIS — I10 Essential (primary) hypertension: Secondary | ICD-10-CM | POA: Diagnosis not present

## 2015-08-15 DIAGNOSIS — B079 Viral wart, unspecified: Secondary | ICD-10-CM

## 2015-08-15 DIAGNOSIS — E669 Obesity, unspecified: Secondary | ICD-10-CM

## 2015-08-15 LAB — LIPID PANEL
Cholesterol: 205 mg/dL — ABNORMAL HIGH (ref 125–200)
HDL: 67 mg/dL (ref >=40)
LDL Cholesterol: 115 mg/dL (ref <130)
Total CHOL/HDL Ratio: 3.1 Ratio (ref <=5.0)
Triglycerides: 115 mg/dL (ref <150)
VLDL: 23 mg/dL (ref <30)

## 2015-08-15 LAB — CBC
HCT: 44.7 % (ref 39.0–52.0)
Hemoglobin: 15.2 g/dL (ref 13.0–17.0)
MCH: 29.5 pg (ref 26.0–34.0)
MCHC: 34 g/dL (ref 30.0–36.0)
MCV: 86.6 fL (ref 78.0–100.0)
MPV: 10.7 fL (ref 8.6–12.4)
Platelets: 250 10*3/uL (ref 150–400)
RBC: 5.16 MIL/uL (ref 4.22–5.81)
RDW: 13.6 % (ref 11.5–15.5)
WBC: 7 K/uL (ref 4.0–10.5)

## 2015-08-15 LAB — COMPREHENSIVE METABOLIC PANEL
Alkaline Phosphatase: 61 U/L (ref 40–115)
BUN: 17 mg/dL (ref 7–25)
CO2: 26 mmol/L (ref 20–31)
Calcium: 9.2 mg/dL (ref 8.6–10.3)
Chloride: 101 mmol/L (ref 98–110)
Glucose, Bld: 99 mg/dL (ref 65–99)
Potassium: 4.4 mmol/L (ref 3.5–5.3)

## 2015-08-15 LAB — COMPREHENSIVE METABOLIC PANEL WITH GFR
ALT: 21 U/L (ref 9–46)
AST: 16 U/L (ref 10–40)
Albumin: 4.3 g/dL (ref 3.6–5.1)
Creat: 1.25 mg/dL (ref 0.60–1.35)
Sodium: 138 mmol/L (ref 135–146)
Total Bilirubin: 0.5 mg/dL (ref 0.2–1.2)
Total Protein: 6.8 g/dL (ref 6.1–8.1)

## 2015-08-15 LAB — LDL CHOLESTEROL, DIRECT: Direct LDL: 135 mg/dL — ABNORMAL HIGH (ref <130)

## 2015-08-15 MED ORDER — PANTOPRAZOLE SODIUM 40 MG PO TBEC: 40.0000 mg | DELAYED_RELEASE_TABLET | Freq: Every day | ORAL | Status: DC

## 2015-08-15 MED ORDER — LISINOPRIL 40 MG PO TABS: 40.0000 mg | ORAL_TABLET | Freq: Every day | ORAL | Status: DC

## 2015-08-15 NOTE — Assessment & Plan Note (Signed)
Cryotherapy of a wart on the left thumb as well as the right second, third, fourth fingers.

## 2015-08-15 NOTE — Assessment & Plan Note (Signed)
Rechecking blood work. 

## 2015-08-15 NOTE — Assessment & Plan Note (Signed)
Agrees to continue to work on weight loss and low-sodium diet.

## 2015-08-15 NOTE — Progress Notes (Signed)
  Subjective:    CC: Follow-up  HPI: Hypertension: Under moderate control but did have some coffee this morning  Comment wart: Desires cryotherapy. Previous areas have resolved well with cryotherapy.  Past medical history, Surgical history, Family history not pertinant except as noted below, Social history, Allergies, and medications have been entered into the medical record, reviewed, and no changes needed.   Review of Systems: No fevers, chills, night sweats, weight loss, chest pain, or shortness of breath.   Objective:    General: Well Developed, well nourished, and in no acute distress.  Neuro: Alert and oriented x3, extra-ocular muscles intact, sensation grossly intact.  HEENT: Normocephalic, atraumatic, pupils equal round reactive to light, neck supple, no masses, no lymphadenopathy, thyroid nonpalpable.  Skin: Warm and dry, no rashes. Cardiac: Regular rate and rhythm, no murmurs rubs or gallops, no lower extremity edema.  Respiratory: Clear to auscultation bilaterally. Not using accessory muscles, speaking in full sentences. Hands: There is a single wart on the left thumb, and 3 warts on the right hand at the second, third, and fourth fingers.  Procedure:  Cryodestruction of 4 warts on the hands Consent obtained and verified. Time-out conducted. Noted no overlying erythema, induration, or other signs of local infection. Completed without difficulty using Cryo-Gun. Advised to call if fevers/chills, erythema, induration, drainage, or persistent bleeding.  Impression and Recommendations:

## 2015-08-16 LAB — HEMOGLOBIN A1C
Hgb A1c MFr Bld: 5.6 % (ref <5.7)
Mean Plasma Glucose: 114 mg/dL (ref <117)

## 2015-08-16 LAB — TSH: TSH: 0.835 u[IU]/mL (ref 0.350–4.500)

## 2015-08-16 LAB — TESTOSTERONE, FREE, TOTAL, SHBG
Sex Hormone Binding: 26 nmol/L (ref 10–50)
Testosterone, Free: 96.1 pg/mL (ref 47.0–244.0)
Testosterone-% Free: 2.3 % (ref 1.6–2.9)
Testosterone: 413 ng/dL (ref 300–890)

## 2015-08-16 LAB — VITAMIN D 25 HYDROXY (VIT D DEFICIENCY, FRACTURES): Vit D, 25-Hydroxy: 33 ng/mL (ref 30–100)

## 2015-12-09 MED FILL — PANTOPRAZOLE SOD DR 40 MG T: 40 | 90 days supply | Qty: 90 | Fill #1

## 2015-12-09 MED FILL — LISINOPRIL 40 MG TABLET: 40 | 90 days supply | Qty: 90 | Fill #1

## 2016-02-13 ENCOUNTER — Ambulatory Visit: Payer: 59 | Admitting: Sports Medicine

## 2016-02-16 ENCOUNTER — Ambulatory Visit (INDEPENDENT_AMBULATORY_CARE_PROVIDER_SITE_OTHER): Payer: 59 | Admitting: Sports Medicine

## 2016-02-16 VITALS — BP 125/75 | HR 69 | Resp 18 | Wt 229.2 lb

## 2016-02-16 DIAGNOSIS — B079 Viral wart, unspecified: Secondary | ICD-10-CM

## 2016-02-16 DIAGNOSIS — E785 Hyperlipidemia, unspecified: Secondary | ICD-10-CM

## 2016-02-16 DIAGNOSIS — I10 Essential (primary) hypertension: Secondary | ICD-10-CM | POA: Diagnosis not present

## 2016-02-16 MED ORDER — ATORVASTATIN CALCIUM 10 MG PO TABS: 10.0000 mg | ORAL_TABLET | Freq: Every day | ORAL | Status: DC

## 2016-02-16 MED FILL — ATORVASTATIN 10 MG TABLET: 10 | 90 days supply | Qty: 90 | Fill #0

## 2016-02-16 NOTE — Assessment & Plan Note (Signed)
Repeat coring out and cryotherapy as above

## 2016-02-16 NOTE — Progress Notes (Signed)
  Subjective:    CC: follow-up  HPI: Hypertension: Controlled.  Hyperlipidemia:Diet is actually worsen, agreeable to start a statin.  Skin wart: Has had 2 cryotherapy sessions, persistence of wart.  Past medical history, Surgical history, Family history not pertinant except as noted below, Social history, Allergies, and medications have been entered into the medical record, reviewed, and no changes needed.   Review of Systems: No fevers, chills, night sweats, weight loss, chest pain, or shortness of breath.   Objective:    General: Well Developed, well nourished, and in no acute distress.  Neuro: Alert and oriented x3, extra-ocular muscles intact, sensation grossly intact.  HEENT: Normocephalic, atraumatic, pupils equal round reactive to light, neck supple, no masses, no lymphadenopathy, thyroid nonpalpable.  Skin: Warm and dry, no rashes. Cardiac: Regular rate and rhythm, no murmurs rubs or gallops, no lower extremity edema.  Respiratory: Clear to auscultation bilaterally. Not using accessory muscles, speaking in full sentences. Right hand: Skin wart on the second finger.  Procedure:  Cryodestruction of right second finger wart Consent obtained and verified. Time-out conducted. Noted no overlying erythema, induration, or other signs of local infection. Using a #15 blade I shaved the wart down. Completed without difficulty using Cryo-Gun. Advised to call if fevers/chills, erythema, induration, drainage, or persistent bleeding.  Impression and Recommendations:

## 2016-02-16 NOTE — Assessment & Plan Note (Signed)
Well controlled, no changes 

## 2016-02-16 NOTE — Assessment & Plan Note (Signed)
Starting Lipitor 10, recheck in 3 months

## 2016-03-20 ENCOUNTER — Other Ambulatory Visit: Payer: Self-pay | Admitting: Sports Medicine

## 2016-03-20 MED FILL — LISINOPRIL 40 MG TABLET: 40 | 90 days supply | Qty: 90 | Fill #0

## 2016-03-20 MED FILL — PANTOPRAZOLE SOD DR 40 MG T: 40 | 90 days supply | Qty: 90 | Fill #2

## 2016-06-15 MED FILL — ATORVASTATIN 10 MG TABLET: 10 | 90 days supply | Qty: 90 | Fill #1

## 2016-07-02 MED FILL — LISINOPRIL 40 MG TABLET: 40 | 90 days supply | Qty: 90 | Fill #1

## 2016-07-02 MED FILL — PANTOPRAZOLE SOD DR 40 MG T: 40 | 90 days supply | Qty: 90 | Fill #3

## 2016-10-12 ENCOUNTER — Other Ambulatory Visit: Payer: Self-pay | Admitting: Sports Medicine

## 2016-10-12 DIAGNOSIS — K2100 Gastro-esophageal reflux disease with esophagitis, without bleeding: Secondary | ICD-10-CM

## 2016-10-12 MED FILL — LISINOPRIL 40 MG TABLET: 40 | 90 days supply | Qty: 90 | Fill #0

## 2016-10-12 MED FILL — PANTOPRAZOLE SOD DR 40 MG T: 40 | 90 days supply | Qty: 90 | Fill #0

## 2016-10-12 MED FILL — ATORVASTATIN 10 MG TABLET: 10 | 90 days supply | Qty: 90 | Fill #2

## 2017-01-21 MED FILL — LISINOPRIL 40 MG TABLET: 40 | 90 days supply | Qty: 90 | Fill #1

## 2017-01-21 MED FILL — PANTOPRAZOLE SOD DR 40 MG T: 40 | 90 days supply | Qty: 90 | Fill #1

## 2017-01-21 MED FILL — ATORVASTATIN 10 MG TABLET: 10 | 90 days supply | Qty: 90 | Fill #3

## 2017-05-08 ENCOUNTER — Other Ambulatory Visit: Payer: Self-pay | Admitting: Sports Medicine

## 2017-05-08 DIAGNOSIS — E785 Hyperlipidemia, unspecified: Secondary | ICD-10-CM

## 2017-05-08 MED FILL — ATORVASTATIN 10 MG TABLET: 10 | 90 days supply | Qty: 90 | Fill #0

## 2017-05-08 MED FILL — LISINOPRIL 40 MG TABLET: 40 | 90 days supply | Qty: 90 | Fill #0

## 2017-05-08 MED FILL — PANTOPRAZOLE SOD DR 40 MG T: 40 | 90 days supply | Qty: 90 | Fill #2

## 2017-08-13 MED FILL — LISINOPRIL 40 MG TAB: 40 | 90 days supply | Qty: 90 | Fill #1

## 2017-08-13 MED FILL — ATORVASTATIN 10 MG TABLET: 10 | 90 days supply | Qty: 90 | Fill #1

## 2017-08-13 MED FILL — PANTOPRAZOLE SOD DR 40 MG T: 40 | 90 days supply | Qty: 90 | Fill #3

## 2017-11-18 ENCOUNTER — Other Ambulatory Visit: Payer: Self-pay | Admitting: Sports Medicine

## 2017-11-18 DIAGNOSIS — K2100 Gastro-esophageal reflux disease with esophagitis, without bleeding: Secondary | ICD-10-CM

## 2017-11-18 MED FILL — LISINOPRIL 40 MG TABS: 40 | 90 days supply | Qty: 90 | Fill #0

## 2017-11-18 MED FILL — PANTOPRAZOLE SOD DR 40 MG T: 40 | 90 days supply | Qty: 90 | Fill #0

## 2017-11-18 MED FILL — ATORVASTATIN 10 MG TABLET: 10 | 90 days supply | Qty: 90 | Fill #2

## 2018-02-26 MED FILL — PANTOPRAZOLE SOD DR 40 MG T: 40 | 90 days supply | Qty: 90 | Fill #1

## 2018-02-26 MED FILL — LISINOPRIL 40 MG TABS: 40 | 90 days supply | Qty: 90 | Fill #1

## 2018-02-26 MED FILL — ATORVASTATIN 10 MG TABLET: 10 | 90 days supply | Qty: 90 | Fill #3

## 2018-06-18 ENCOUNTER — Other Ambulatory Visit: Payer: Self-pay | Admitting: Sports Medicine

## 2018-06-18 DIAGNOSIS — E785 Hyperlipidemia, unspecified: Secondary | ICD-10-CM

## 2018-06-18 MED FILL — ATORVASTATIN 10 MG TABLET: 10 | 90 days supply | Qty: 90 | Fill #0

## 2018-06-18 MED FILL — PANTOPRAZOLE SOD DR 40 MG T: 40 | 90 days supply | Qty: 90 | Fill #2

## 2018-06-18 MED FILL — LISINOPRIL 40 MG TABS: 40 | 90 days supply | Qty: 90 | Fill #0

## 2018-09-25 ENCOUNTER — Other Ambulatory Visit: Payer: Self-pay | Admitting: Sports Medicine

## 2018-09-25 DIAGNOSIS — E785 Hyperlipidemia, unspecified: Secondary | ICD-10-CM

## 2018-09-25 MED FILL — LISINOPRIL 40 MG TABLET: 40 | 90 days supply | Qty: 90 | Fill #1

## 2018-09-25 MED FILL — PANTOPRAZOLE SOD DR 40 MG T: 40 | 90 days supply | Qty: 90 | Fill #3

## 2018-12-26 ENCOUNTER — Other Ambulatory Visit: Payer: Self-pay | Admitting: Sports Medicine

## 2018-12-26 DIAGNOSIS — K2100 Gastro-esophageal reflux disease with esophagitis, without bleeding: Secondary | ICD-10-CM

## 2019-01-01 ENCOUNTER — Ambulatory Visit (INDEPENDENT_AMBULATORY_CARE_PROVIDER_SITE_OTHER): Payer: No Typology Code available for payment source | Admitting: Sports Medicine

## 2019-01-01 ENCOUNTER — Encounter: Payer: Self-pay | Admitting: Sports Medicine

## 2019-01-01 DIAGNOSIS — K2100 Gastro-esophageal reflux disease with esophagitis, without bleeding: Secondary | ICD-10-CM

## 2019-01-01 DIAGNOSIS — E785 Hyperlipidemia, unspecified: Secondary | ICD-10-CM | POA: Diagnosis not present

## 2019-01-01 DIAGNOSIS — Z0000 Encounter for general adult medical examination without abnormal findings: Secondary | ICD-10-CM | POA: Diagnosis not present

## 2019-01-01 DIAGNOSIS — I10 Essential (primary) hypertension: Secondary | ICD-10-CM | POA: Diagnosis not present

## 2019-01-01 MED ORDER — LISINOPRIL 40 MG PO TABS: 40.0000 mg | ORAL_TABLET | Freq: Every day | ORAL | 3 refills | Status: DC

## 2019-01-01 MED ORDER — ATORVASTATIN CALCIUM 10 MG PO TABS: 10.0000 mg | ORAL_TABLET | Freq: Every day | ORAL | 3 refills | Status: DC

## 2019-01-01 MED FILL — LISINOPRIL 40 MG TABLET: 40 | 90 days supply | Qty: 90 | Fill #0

## 2019-01-01 MED FILL — ATORVASTATIN 10 MG TABLET: 10 | 90 days supply | Qty: 90 | Fill #0

## 2019-01-01 NOTE — Assessment & Plan Note (Signed)
Rechecking lipids. 

## 2019-01-01 NOTE — Progress Notes (Signed)
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  Hypertension: Controlled on lisinopril.  Has not had labs in some time now.  Hyperlipidemia: Historically controlled on atorvastatin 10, also has not had labs for this.  Preventive measures: Up-to-date on vaccinations, he does need varicella-zoster titers.  He is not due for tuberculosis screening yet.  I reviewed the past medical history, family history, social history, surgical history, and allergies today and no changes were needed.  Please see the problem list section below in epic for further details.  Past Medical History: Past Medical History:  Diagnosis Date  . Hypertension   . Obesity    Past Surgical History: Past Surgical History:  Procedure Laterality Date  . ADENOIDECTOMY    . TONSILLECTOMY    . VASECTOMY     Social History: Social History   Socioeconomic History  . Marital status: Married    Spouse name: Not on file  . Number of children: Not on file  . Years of education: Not on file  . Highest education level: Not on file  Occupational History  . Not on file  Social Needs  . Financial resource strain: Not on file  . Food insecurity:    Worry: Not on file    Inability: Not on file  . Transportation needs:    Medical: Not on file    Non-medical: Not on file  Tobacco Use  . Smoking status: Never Smoker  . Smokeless tobacco: Current User    Types: Chew  Substance and Sexual Activity  . Alcohol use: Yes    Alcohol/week: 2.0 standard drinks    Types: 2 drink(s) per week    Comment: daily  . Drug use: No  . Sexual activity: Not on file  Lifestyle  . Physical activity:    Days per week: Not on file    Minutes per session: Not on file  . Stress: Not on file  Relationships  . Social connections:    Talks on phone: Not on file    Gets together: Not on file    Attends religious service: Not on file    Active member of  club or organization: Not on file    Attends meetings of clubs or organizations: Not on file    Relationship status: Not on file  Other Topics Concern  . Not on file  Social History Narrative  . Not on file   Family History: Family History  Problem Relation Age of Onset  . Depression Mother   . Alcohol abuse Mother   . Hyperlipidemia Father   . Hypertension Father   . Alcohol abuse Father   . Depression Father   . Heart attack Paternal Grandfather 5  . Cancer Maternal Grandfather        prostate and bladder CA    Allergies: No Known Allergies Medications: See med rec.  Review of Systems: No fevers, chills, night sweats, weight loss, chest pain, or shortness of breath.   Objective:    General: Well Developed, well nourished, and in no acute distress.  Neuro: Alert and oriented x3, extra-ocular muscles intact, sensation grossly intact. Cranial nerves II through XII are intact, motor, sensory, and coordinative functions are all intact. HEENT: Normocephalic, atraumatic, pupils equal round reactive to light, neck supple, no masses, no lymphadenopathy, thyroid nonpalpable. Oropharynx, nasopharynx, external ear canals are unremarkable. Skin: Warm and dry, no rashes noted.  Cardiac:  Regular rate and rhythm, no murmurs rubs or gallops.  Respiratory: Clear to auscultation bilaterally. Not using accessory muscles, speaking in full sentences.  Abdominal: Soft, nontender, nondistended, positive bowel sounds, no masses, no organomegaly.  Musculoskeletal: Shoulder, elbow, wrist, hip, knee, ankle stable, and with full range of motion.  Impression and Recommendations:    Annual physical exam Annual physical as above. Up-to-date on tetanus, flu. Checking routine labs. Needs varicella-zoster titers as well. I am happy to do QuantiFERON gold when his TB screening comes up again.  Essential hypertension, benign Refilling lisinopril.   Hyperlipidemia LDL goal <100 Rechecking  lipids.  ___________________________________________ Ihor Austinhomas J. Benjamin Stainhekkekandam, M.D., ABFM., CAQSM. Primary Care and Sports Medicine [facility]  Adjunct Professor of Family Medicine  [employer]

## 2019-01-01 NOTE — Assessment & Plan Note (Addendum)
Annual physical as above. Up-to-date on tetanus, flu. Checking routine labs. Needs varicella-zoster titers as well. I am happy to do QuantiFERON gold when his TB screening comes up again.

## 2019-01-01 NOTE — Assessment & Plan Note (Signed)
Refilling lisinopril.

## 2019-01-02 LAB — CBC
HCT: 44.2 % (ref 38.5–50.0)
Hemoglobin: 15 g/dL (ref 13.2–17.1)
MCH: 29.5 pg (ref 27.0–33.0)
MCHC: 33.9 g/dL (ref 32.0–36.0)
MCV: 87 fL (ref 80.0–100.0)
MPV: 11.1 fL (ref 7.5–12.5)
Platelets: 269 10*3/uL (ref 140–400)
RBC: 5.08 Million/uL (ref 4.20–5.80)
RDW: 12.7 % (ref 11.0–15.0)
WBC: 5.9 Thousand/uL (ref 3.8–10.8)

## 2019-01-02 LAB — LIPID PANEL W/REFLEX DIRECT LDL
Cholesterol: 168 mg/dL (ref <200)
HDL: 54 mg/dL (ref >=40)
LDL Cholesterol (Calc): 97 mg/dL (calc)
Non-HDL Cholesterol (Calc): 114 mg/dL (calc) (ref <130)
Total CHOL/HDL Ratio: 3.1 (calc) (ref <5.0)
Triglycerides: 76 mg/dL (ref <150)

## 2019-01-02 LAB — COMPREHENSIVE METABOLIC PANEL WITH GFR
AG Ratio: 1.7 (calc) (ref 1.0–2.5)
BUN/Creatinine Ratio: 8 (calc) (ref 6–22)
CO2: 29 mmol/L (ref 20–32)
Chloride: 105 mmol/L (ref 98–110)
Globulin: 2.4 g/dL (ref 1.9–3.7)
Glucose, Bld: 96 mg/dL (ref 65–99)
Sodium: 139 mmol/L (ref 135–146)
Total Protein: 6.4 g/dL (ref 6.1–8.1)

## 2019-01-02 LAB — HEMOGLOBIN A1C
Hgb A1c MFr Bld: 5.4 % of total Hgb (ref <5.7)
Mean Plasma Glucose: 108 (calc)
eAG (mmol/L): 6 (calc)

## 2019-01-02 LAB — COMPREHENSIVE METABOLIC PANEL
ALT: 21 U/L (ref 9–46)
AST: 15 U/L (ref 10–40)
Albumin: 4 g/dL (ref 3.6–5.1)
Alkaline phosphatase (APISO): 58 U/L (ref 36–130)
BUN: 11 mg/dL (ref 7–25)
Calcium: 9.3 mg/dL (ref 8.6–10.3)
Creat: 1.37 mg/dL — ABNORMAL HIGH (ref 0.60–1.35)
Potassium: 4.3 mmol/L (ref 3.5–5.3)
Total Bilirubin: 0.5 mg/dL (ref 0.2–1.2)

## 2019-01-02 LAB — TSH: TSH: 0.93 mIU/L (ref 0.40–4.50)

## 2019-01-02 LAB — VITAMIN D 25 HYDROXY (VIT D DEFICIENCY, FRACTURES): Vit D, 25-Hydroxy: 27 ng/mL — ABNORMAL LOW (ref 30–100)

## 2019-01-02 LAB — VARICELLA ZOSTER ANTIBODY, IGG: Varicella IgG: 1515 index

## 2019-01-02 LAB — HIV ANTIBODY (ROUTINE TESTING W REFLEX): HIV 1&2 Ab, 4th Generation: NONREACTIVE

## 2019-01-02 MED ORDER — PANTOPRAZOLE SODIUM 40 MG PO TBEC: 40.0000 mg | DELAYED_RELEASE_TABLET | Freq: Every day | ORAL | 3 refills | Status: DC

## 2019-01-02 MED ORDER — VITAMIN D (ERGOCALCIFEROL) 1.25 MG (50000 UNIT) PO CAPS: 50000.0000 [IU] | ORAL_CAPSULE | ORAL | 0 refills | Status: DC

## 2019-01-02 MED FILL — VIT D2 1.25 MG (50,000 UNIT: 1.25 MG | 56 days supply | Qty: 8 | Fill #0

## 2019-01-02 MED FILL — PANTOPRAZOLE SOD DR 40 MG T: 40 | 90 days supply | Qty: 90 | Fill #0

## 2019-01-02 NOTE — Addendum Note (Signed)
Addended by: Jed Limerick on: 01/02/2019 09:37 AM   Modules accepted: Orders

## 2019-01-02 NOTE — Addendum Note (Signed)
Addended by: Monica Becton on: 01/02/2019 07:41 AM   Modules accepted: Orders

## 2019-01-06 ENCOUNTER — Encounter: Payer: 59 | Admitting: Sports Medicine

## 2019-04-02 MED FILL — PANTOPRAZOLE SOD DR 40 MG T: 40 | 90 days supply | Qty: 90 | Fill #1

## 2019-04-02 MED FILL — ATORVASTATIN 10 MG TABLET: 10 | 90 days supply | Qty: 90 | Fill #1

## 2019-04-02 MED FILL — LISINOPRIL 40 MG TABLET: 40 | 90 days supply | Qty: 90 | Fill #1

## 2019-07-06 MED FILL — PANTOPRAZOLE SOD DR 40 MG T: 40 | 90 days supply | Qty: 90 | Fill #2

## 2019-07-06 MED FILL — LISINOPRIL 40 MG TABLET: 40 | 90 days supply | Qty: 90 | Fill #2

## 2019-07-06 MED FILL — ATORVASTATIN 10 MG TABLET: 10 | 90 days supply | Qty: 90 | Fill #2

## 2019-10-05 MED FILL — ATORVASTATIN 10 MG TABLET: 10 | 90 days supply | Qty: 90 | Fill #3

## 2019-10-05 MED FILL — PANTOPRAZOLE SOD DR 40 MG T: 40 | 90 days supply | Qty: 90 | Fill #3

## 2019-10-05 MED FILL — LISINOPRIL 40 MG TABLET: 40 | 90 days supply | Qty: 90 | Fill #3

## 2019-11-25 DIAGNOSIS — I10 Essential (primary) hypertension: Secondary | ICD-10-CM

## 2019-11-25 DIAGNOSIS — R5383 Other fatigue: Secondary | ICD-10-CM

## 2019-11-26 DIAGNOSIS — G4719 Other hypersomnia: Secondary | ICD-10-CM | POA: Insufficient documentation

## 2019-11-26 DIAGNOSIS — R5383 Other fatigue: Secondary | ICD-10-CM | POA: Insufficient documentation

## 2019-11-26 NOTE — Assessment & Plan Note (Signed)
Nonspecific, starting work-up, if completely unremarkable we will proceed with sleep study and evaluate mood.

## 2020-01-04 ENCOUNTER — Other Ambulatory Visit: Payer: Self-pay | Admitting: Sports Medicine

## 2020-01-04 ENCOUNTER — Ambulatory Visit (INDEPENDENT_AMBULATORY_CARE_PROVIDER_SITE_OTHER): Payer: No Typology Code available for payment source | Admitting: Sports Medicine

## 2020-01-04 ENCOUNTER — Encounter: Payer: Self-pay | Admitting: Sports Medicine

## 2020-01-04 ENCOUNTER — Other Ambulatory Visit: Payer: Self-pay

## 2020-01-04 DIAGNOSIS — E785 Hyperlipidemia, unspecified: Secondary | ICD-10-CM | POA: Diagnosis not present

## 2020-01-04 DIAGNOSIS — I10 Essential (primary) hypertension: Secondary | ICD-10-CM

## 2020-01-04 DIAGNOSIS — R5383 Other fatigue: Secondary | ICD-10-CM | POA: Diagnosis not present

## 2020-01-04 DIAGNOSIS — K2100 Gastro-esophageal reflux disease with esophagitis, without bleeding: Secondary | ICD-10-CM

## 2020-01-04 DIAGNOSIS — N529 Male erectile dysfunction, unspecified: Secondary | ICD-10-CM

## 2020-01-04 DIAGNOSIS — Z0000 Encounter for general adult medical examination without abnormal findings: Secondary | ICD-10-CM | POA: Diagnosis not present

## 2020-01-04 DIAGNOSIS — Z1152 Encounter for screening for COVID-19: Secondary | ICD-10-CM | POA: Insufficient documentation

## 2020-01-04 MED ORDER — TADALAFIL 5 MG PO TABS: 5.0000 mg | ORAL_TABLET | Freq: Every day | ORAL | 11 refills | Status: DC | PRN

## 2020-01-04 MED FILL — ATORVASTATIN 10 MG TABLET: 10 | 90 days supply | Qty: 90 | Fill #0

## 2020-01-04 MED FILL — PANTOPRAZOLE SOD DR 40 MG T: 40 | 90 days supply | Qty: 90 | Fill #0

## 2020-01-04 MED FILL — LISINOPRIL 40 MG TABLET: 40 | 90 days supply | Qty: 90 | Fill #0

## 2020-01-04 NOTE — Assessment & Plan Note (Signed)
John Conway has had a few episodes of erectile dysfunction with good quality but lack of duration. We are going to add 5 mg of Cialis with a good Rx coupon to [employer].

## 2020-01-04 NOTE — Assessment & Plan Note (Signed)
Rechecking lipids today. 

## 2020-01-04 NOTE — Assessment & Plan Note (Signed)
John Conway has nonspecific fatigue, we are starting the work-up today. If unremarkable we will proceed with a sleep study and mood evaluation.

## 2020-01-04 NOTE — Assessment & Plan Note (Signed)
Adding COVID-19 antibodies, patient had both of his vaccines

## 2020-01-04 NOTE — Assessment & Plan Note (Signed)
Blood pressure is well controlled, no change in plan.

## 2020-01-04 NOTE — Assessment & Plan Note (Signed)
Annual physical today. John Conway has had both of his COVID-19 vaccines.

## 2020-01-04 NOTE — Progress Notes (Signed)
Subjective:    CC: Annual Physical Exam  HPI:  This patient is here for their annual physical  I reviewed the past medical history, family history, social history, surgical history, and allergies today and no changes were needed.  Please see the problem list section below in epic for further details.  Past Medical History: Past Medical History:  Diagnosis Date  . Hypertension   . Obesity    Past Surgical History: Past Surgical History:  Procedure Laterality Date  . ADENOIDECTOMY    . TONSILLECTOMY    . VASECTOMY     Social History: Social History   Socioeconomic History  . Marital status: Married    Spouse name: Not on file  . Number of children: Not on file  . Years of education: Not on file  . Highest education level: Not on file  Occupational History  . Not on file  Tobacco Use  . Smoking status: Never Smoker  . Smokeless tobacco: Current User    Types: Chew  Substance and Sexual Activity  . Alcohol use: Yes    Alcohol/week: 2.0 standard drinks    Types: 2 drink(s) per week    Comment: daily  . Drug use: No  . Sexual activity: Not on file  Other Topics Concern  . Not on file  Social History Narrative  . Not on file   Social Determinants of Health   Financial Resource Strain:   . Difficulty of Paying Living Expenses: Not on file  Food Insecurity:   . Worried About Programme researcher, broadcasting/film/video in the Last Year: Not on file  . Ran Out of Food in the Last Year: Not on file  Transportation Needs:   . Lack of Transportation (Medical): Not on file  . Lack of Transportation (Non-Medical): Not on file  Physical Activity:   . Days of Exercise per Week: Not on file  . Minutes of Exercise per Session: Not on file  Stress:   . Feeling of Stress : Not on file  Social Connections:   . Frequency of Communication with Friends and Family: Not on file  . Frequency of Social Gatherings with Friends and Family: Not on file  . Attends Religious Services: Not on file  .  Active Member of Clubs or Organizations: Not on file  . Attends Banker Meetings: Not on file  . Marital Status: Not on file   Family History: Family History  Problem Relation Age of Onset  . Depression Mother   . Alcohol abuse Mother   . Hyperlipidemia Father   . Hypertension Father   . Alcohol abuse Father   . Depression Father   . Heart attack Paternal Grandfather 66  . Cancer Maternal Grandfather        prostate and bladder CA    Allergies: No Known Allergies Medications: See med rec.  Review of Systems: No headache, visual changes, nausea, vomiting, diarrhea, constipation, dizziness, abdominal pain, skin rash, fevers, chills, night sweats, swollen lymph nodes, weight loss, chest pain, body aches, joint swelling, muscle aches, shortness of breath, mood changes, visual or auditory hallucinations.  Objective:    General: Well Developed, well nourished, and in no acute distress.  Neuro: Alert and oriented x3, extra-ocular muscles intact, sensation grossly intact. Cranial nerves II through XII are intact, motor, sensory, and coordinative functions are all intact. HEENT: Normocephalic, atraumatic, pupils equal round reactive to light, neck supple, no masses, no lymphadenopathy, thyroid nonpalpable. Oropharynx, nasopharynx, external ear canals are unremarkable. Skin: Warm  and dry, no rashes noted.  Cardiac: Regular rate and rhythm, no murmurs rubs or gallops.  Respiratory: Clear to auscultation bilaterally. Not using accessory muscles, speaking in full sentences.  Abdominal: Soft, nontender, nondistended, positive bowel sounds, no masses, no organomegaly.  Musculoskeletal: Shoulder, elbow, wrist, hip, knee, ankle stable, and with full range of motion.  Impression and Recommendations:    The patient was counselled, risk factors were discussed, anticipatory guidance given.  Annual physical exam Annual physical today. Juwaun has had both of his COVID-19  vaccines.  Essential hypertension, benign Blood pressure is well controlled, no change in plan.  Fatigue Yakub has nonspecific fatigue, we are starting the work-up today. If unremarkable we will proceed with a sleep study and mood evaluation.  Hyperlipidemia LDL goal <100 Rechecking lipids today.  Erectile dysfunction Jadarious has had a few episodes of erectile dysfunction with good quality but lack of duration. We are going to add 5 mg of Cialis with a good Rx coupon to [employer].  Encounter for screening for COVID-19 Adding COVID-19 antibodies, patient had both of his vaccines   ___________________________________________ Gwen Her. Dianah Field, M.D., ABFM., CAQSM. Primary Care and Sports Medicine [facility]  Adjunct Professor of [place] [employer] [facility] of [employer]

## 2020-01-05 LAB — SAR COV2 SEROLOGY (COVID19)AB(IGG),IA: SARS CoV2 AB IGG: POSITIVE — AB

## 2020-01-08 LAB — CBC
HCT: 45.8 % (ref 38.5–50.0)
Hemoglobin: 15.7 g/dL (ref 13.2–17.1)
MCH: 30.3 pg (ref 27.0–33.0)
MCHC: 34.3 g/dL (ref 32.0–36.0)
MCV: 88.4 fL (ref 80.0–100.0)
MPV: 10.8 fL (ref 7.5–12.5)
Platelets: 255 10*3/uL (ref 140–400)
RBC: 5.18 10*6/uL (ref 4.20–5.80)
RDW: 12.1 % (ref 11.0–15.0)
WBC: 5.1 10*3/uL (ref 3.8–10.8)

## 2020-01-08 LAB — TSH: TSH: 1.1 mIU/L (ref 0.40–4.50)

## 2020-01-08 LAB — LIPID PANEL W/REFLEX DIRECT LDL
Cholesterol: 172 mg/dL (ref <200)
HDL: 58 mg/dL (ref >=40)
LDL Cholesterol (Calc): 94 mg/dL (calc)
Non-HDL Cholesterol (Calc): 114 mg/dL (calc) (ref <130)
Total CHOL/HDL Ratio: 3 (calc) (ref <5.0)
Triglycerides: 107 mg/dL (ref <150)

## 2020-01-08 LAB — COMPLETE METABOLIC PANEL WITH GFR
AG Ratio: 1.8 (calc) (ref 1.0–2.5)
ALT: 19 U/L (ref 9–46)
AST: 18 U/L (ref 10–40)
Albumin: 4.2 g/dL (ref 3.6–5.1)
Alkaline phosphatase (APISO): 65 U/L (ref 36–130)
BUN: 20 mg/dL (ref 7–25)
CO2: 29 mmol/L (ref 20–32)
Calcium: 9.4 mg/dL (ref 8.6–10.3)
Chloride: 102 mmol/L (ref 98–110)
Creat: 1.16 mg/dL (ref 0.60–1.35)
GFR, Est African American: 86 mL/min/{1.73_m2} (ref >=60)
GFR, Est Non African American: 75 mL/min/{1.73_m2} (ref >=60)
Globulin: 2.4 g/dL (calc) (ref 1.9–3.7)
Glucose, Bld: 106 mg/dL — ABNORMAL HIGH (ref 65–99)
Potassium: 4.4 mmol/L (ref 3.5–5.3)
Sodium: 137 mmol/L (ref 135–146)
Total Bilirubin: 0.4 mg/dL (ref 0.2–1.2)
Total Protein: 6.6 g/dL (ref 6.1–8.1)

## 2020-01-08 LAB — VITAMIN D 25 HYDROXY (VIT D DEFICIENCY, FRACTURES): Vit D, 25-Hydroxy: 21 ng/mL — ABNORMAL LOW (ref 30–100)

## 2020-01-08 LAB — TESTOSTERONE, FREE & TOTAL
Free Testosterone: 85.2 pg/mL (ref 35.0–155.0)
Testosterone, Total, LC-MS-MS: 471 ng/dL (ref 250–1100)

## 2020-01-08 LAB — HEMOGLOBIN A1C
Hgb A1c MFr Bld: 5.4 % of total Hgb (ref <5.7)
Mean Plasma Glucose: 108 (calc)
eAG (mmol/L): 6 (calc)

## 2020-05-26 ENCOUNTER — Encounter (INDEPENDENT_AMBULATORY_CARE_PROVIDER_SITE_OTHER): Payer: No Typology Code available for payment source

## 2020-05-26 DIAGNOSIS — N529 Male erectile dysfunction, unspecified: Secondary | ICD-10-CM | POA: Diagnosis not present

## 2020-07-04 MED FILL — ATORVASTATIN CALCIUM 10 MG: 10 | 90 days supply | Qty: 90 | Fill #2

## 2020-07-04 MED FILL — LISINOPRIL 40 MG TABS: 40 | 90 days supply | Qty: 90 | Fill #2

## 2020-07-04 MED FILL — PANTOPRAZOLE SOD DR 40 MG T: 40 | 90 days supply | Qty: 90 | Fill #2

## 2020-10-03 MED FILL — PANTOPRAZOLE SOD DR 40 MG T: 40 | 90 days supply | Qty: 90 | Fill #3

## 2020-10-03 MED FILL — LISINOPRIL 40 MG TABS: 40 | 90 days supply | Qty: 90 | Fill #3

## 2020-10-03 MED FILL — ATORVASTATIN CALCIUM 10 MG: 10 | 90 days supply | Qty: 90 | Fill #3

## 2020-11-30 ENCOUNTER — Ambulatory Visit (INDEPENDENT_AMBULATORY_CARE_PROVIDER_SITE_OTHER): Payer: No Typology Code available for payment source

## 2020-11-30 ENCOUNTER — Other Ambulatory Visit: Payer: Self-pay

## 2020-11-30 ENCOUNTER — Ambulatory Visit (INDEPENDENT_AMBULATORY_CARE_PROVIDER_SITE_OTHER): Payer: No Typology Code available for payment source | Admitting: Sports Medicine

## 2020-11-30 ENCOUNTER — Other Ambulatory Visit: Payer: Self-pay | Admitting: Sports Medicine

## 2020-11-30 DIAGNOSIS — M5412 Radiculopathy, cervical region: Secondary | ICD-10-CM

## 2020-11-30 DIAGNOSIS — M50322 Other cervical disc degeneration at C5-C6 level: Secondary | ICD-10-CM

## 2020-11-30 DIAGNOSIS — M50321 Other cervical disc degeneration at C4-C5 level: Secondary | ICD-10-CM

## 2020-11-30 DIAGNOSIS — M4802 Spinal stenosis, cervical region: Secondary | ICD-10-CM | POA: Diagnosis not present

## 2020-11-30 MED ORDER — CYCLOBENZAPRINE HCL 10 MG PO TABS: ORAL_TABLET | ORAL | 0 refills | Status: DC

## 2020-11-30 MED ORDER — PREDNISONE 50 MG PO TABS: ORAL_TABLET | ORAL | 0 refills | Status: DC

## 2020-11-30 MED FILL — CYCLOBENZAPRINE HCL 10 MG T: 10 | 10 days supply | Qty: 30 | Fill #0

## 2020-11-30 MED FILL — predniSONE 50 MG TABS: 50 | 5 days supply | Qty: 5 | Fill #0

## 2020-11-30 NOTE — Assessment & Plan Note (Signed)
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 Mostly sensory symptoms, adding x-rays, prednisone, Flexeril. He would like a referral to [facility] with chiropractic care, if this fails after 4 to 6 weeks then we will consider physical therapy and MRI/epidural.

## 2020-11-30 NOTE — Progress Notes (Signed)
    Procedures performed today:    None.  Independent interpretation of notes and tests performed by another provider:   None.  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 Mostly sensory symptoms, adding x-rays, prednisone, Flexeril. He would like a referral to [facility] with chiropractic care, if this fails after 4 to 6 weeks then we will consider physical therapy and MRI/epidural.    ___________________________________________ Ihor Austin. Benjamin Stain, M.D., ABFM., CAQSM. Primary Care and Sports Medicine [facility]  Adjunct Instructor of Family Medicine  [employer]

## 2021-01-02 ENCOUNTER — Other Ambulatory Visit: Payer: Self-pay | Admitting: Sports Medicine

## 2021-01-02 DIAGNOSIS — E785 Hyperlipidemia, unspecified: Secondary | ICD-10-CM

## 2021-01-02 DIAGNOSIS — K2100 Gastro-esophageal reflux disease with esophagitis, without bleeding: Secondary | ICD-10-CM

## 2021-01-02 DIAGNOSIS — I10 Essential (primary) hypertension: Secondary | ICD-10-CM

## 2021-01-02 MED FILL — PANTOPRAZOLE SOD DR 40 MG T: 40 | 90 days supply | Qty: 90 | Fill #0

## 2021-01-02 MED FILL — LISINOPRIL 40 MG TABS: 40 | 90 days supply | Qty: 90 | Fill #0

## 2021-01-02 MED FILL — ATORVASTATIN CALCIUM 10 MG: 10 | 90 days supply | Qty: 90 | Fill #0

## 2021-01-11 ENCOUNTER — Other Ambulatory Visit: Payer: Self-pay

## 2021-01-11 ENCOUNTER — Encounter: Payer: Self-pay | Admitting: Sports Medicine

## 2021-01-11 ENCOUNTER — Ambulatory Visit (INDEPENDENT_AMBULATORY_CARE_PROVIDER_SITE_OTHER): Payer: No Typology Code available for payment source | Admitting: Sports Medicine

## 2021-01-11 VITALS — BP 122/75 | HR 96 | Ht 70.0 in | Wt 234.0 lb

## 2021-01-11 DIAGNOSIS — I10 Essential (primary) hypertension: Secondary | ICD-10-CM | POA: Diagnosis not present

## 2021-01-11 DIAGNOSIS — Z0000 Encounter for general adult medical examination without abnormal findings: Secondary | ICD-10-CM | POA: Diagnosis not present

## 2021-01-11 DIAGNOSIS — M5412 Radiculopathy, cervical region: Secondary | ICD-10-CM | POA: Diagnosis not present

## 2021-01-11 DIAGNOSIS — G4719 Other hypersomnia: Secondary | ICD-10-CM | POA: Diagnosis not present

## 2021-01-11 DIAGNOSIS — H538 Other visual disturbances: Secondary | ICD-10-CM

## 2021-01-11 NOTE — Progress Notes (Signed)
Subjective:    CC: Annual Physical Exam  HPI:  This patient is here for their annual physical  I reviewed the past medical history, family history, social history, surgical history, and allergies today and no changes were needed.  Please see the problem list section below in epic for further details.  Past Medical History: Past Medical History:  Diagnosis Date  . Hypertension   . Obesity    Past Surgical History: Past Surgical History:  Procedure Laterality Date  . ADENOIDECTOMY    . TONSILLECTOMY    . VASECTOMY     Social History: Social History   Socioeconomic History  . Marital status: Divorced    Spouse name: Not on file  . Number of children: Not on file  . Years of education: Not on file  . Highest education level: Not on file  Occupational History  . Not on file  Tobacco Use  . Smoking status: Never Smoker  . Smokeless tobacco: Current User    Types: Chew  Substance and Sexual Activity  . Alcohol use: Yes    Alcohol/week: 2.0 standard drinks    Types: 2 drink(s) per week    Comment: daily  . Drug use: No  . Sexual activity: Not on file  Other Topics Concern  . Not on file  Social History Narrative  . Not on file   Social Determinants of Health   Financial Resource Strain: Not on file  Food Insecurity: Not on file  Transportation Needs: Not on file  Physical Activity: Not on file  Stress: Not on file  Social Connections: Not on file   Family History: Family History  Problem Relation Age of Onset  . Depression Mother   . Alcohol abuse Mother   . Hyperlipidemia Father   . Hypertension Father   . Alcohol abuse Father   . Depression Father   . Heart attack Paternal Grandfather 88  . Cancer Maternal Grandfather        prostate and bladder CA    Allergies: No Known Allergies Medications: See med rec.  Review of Systems: No headache, visual changes, nausea, vomiting, diarrhea, constipation, dizziness, abdominal pain, skin rash, fevers,  chills, night sweats, swollen lymph nodes, weight loss, chest pain, body aches, joint swelling, muscle aches, shortness of breath, mood changes, visual or auditory hallucinations.  Objective:    General: Well Developed, well nourished, and in no acute distress.  Neuro: Alert and oriented x3, extra-ocular muscles intact, sensation grossly intact. Cranial nerves II through XII are intact, motor, sensory, and coordinative functions are all intact. HEENT: Normocephalic, atraumatic, pupils equal round reactive to light, neck supple, no masses, no lymphadenopathy, thyroid nonpalpable. Oropharynx, nasopharynx, external ear canals are unremarkable. Skin: Warm and dry, no rashes noted.  Cardiac: Regular rate and rhythm, no murmurs rubs or gallops.  Respiratory: Clear to auscultation bilaterally. Not using accessory muscles, speaking in full sentences.  Abdominal: Soft, nontender, nondistended, positive bowel sounds, no masses, no organomegaly.  Musculoskeletal: Shoulder, elbow, wrist, hip, knee, ankle stable, and with full range of motion.  Impression and Recommendations:    The patient was counselled, risk factors were discussed, anticipatory guidance given.  Annual physical exam Annual physical as above. Up-to-date on screening measures. Return to see me in a year.  Right cervical radiculopathy Essentially resolved with chiropractic manipulation. #1 get him set up with some home rehab exercises as well.  Excessive daytime sleepiness Adding a sleep study.  Blurry vision, bilateral Referral to Dr. Doristine Section.   ___________________________________________  Gwen Her. Dianah Field, M.D., ABFM., CAQSM. Primary Care and Sports Medicine [place] [facility]  Adjunct Professor of [place] [employer] [facility] of Medicine

## 2021-01-11 NOTE — Assessment & Plan Note (Signed)
Annual physical as above. Up-to-date on screening measures. Return to see me in a year.

## 2021-01-11 NOTE — Assessment & Plan Note (Signed)
Essentially resolved with chiropractic manipulation. #1 get him set up with some home rehab exercises as well.

## 2021-01-11 NOTE — Assessment & Plan Note (Signed)
Adding a sleep study.

## 2021-01-11 NOTE — Assessment & Plan Note (Signed)
Referral to Dr Subramanian.

## 2021-02-06 ENCOUNTER — Other Ambulatory Visit: Payer: Self-pay | Admitting: Sports Medicine

## 2021-02-06 DIAGNOSIS — N529 Male erectile dysfunction, unspecified: Secondary | ICD-10-CM

## 2021-03-06 ENCOUNTER — Other Ambulatory Visit: Payer: Self-pay

## 2021-03-07 ENCOUNTER — Telehealth: Payer: Self-pay

## 2021-03-07 NOTE — Telephone Encounter (Signed)
PA sent for tadalafil, awaiting response from insurance company.

## 2021-03-19 MED FILL — Pantoprazole Sodium EC Tab 40 MG (Base Equiv): ORAL | 90 days supply | Qty: 90 | Fill #0 | Status: AC

## 2021-03-20 ENCOUNTER — Other Ambulatory Visit (HOSPITAL_BASED_OUTPATIENT_CLINIC_OR_DEPARTMENT_OTHER): Payer: Self-pay

## 2021-03-29 ENCOUNTER — Other Ambulatory Visit (HOSPITAL_BASED_OUTPATIENT_CLINIC_OR_DEPARTMENT_OTHER): Payer: Self-pay

## 2021-03-29 MED FILL — Lisinopril Tab 40 MG: ORAL | 90 days supply | Qty: 90 | Fill #0 | Status: AC

## 2021-03-29 MED FILL — Atorvastatin Calcium Tab 10 MG (Base Equivalent): ORAL | 90 days supply | Qty: 90 | Fill #0 | Status: AC

## 2021-06-11 IMAGING — DX DG CERVICAL SPINE COMPLETE 4+V
6 series · 6 of 6 positions shown · non-contrast
Comparison: None.

CLINICAL DATA: Right neck and upper extremity pain without known
injury.

EXAM:
CERVICAL SPINE - COMPLETE 4+ VIEW

[c-spine lat]
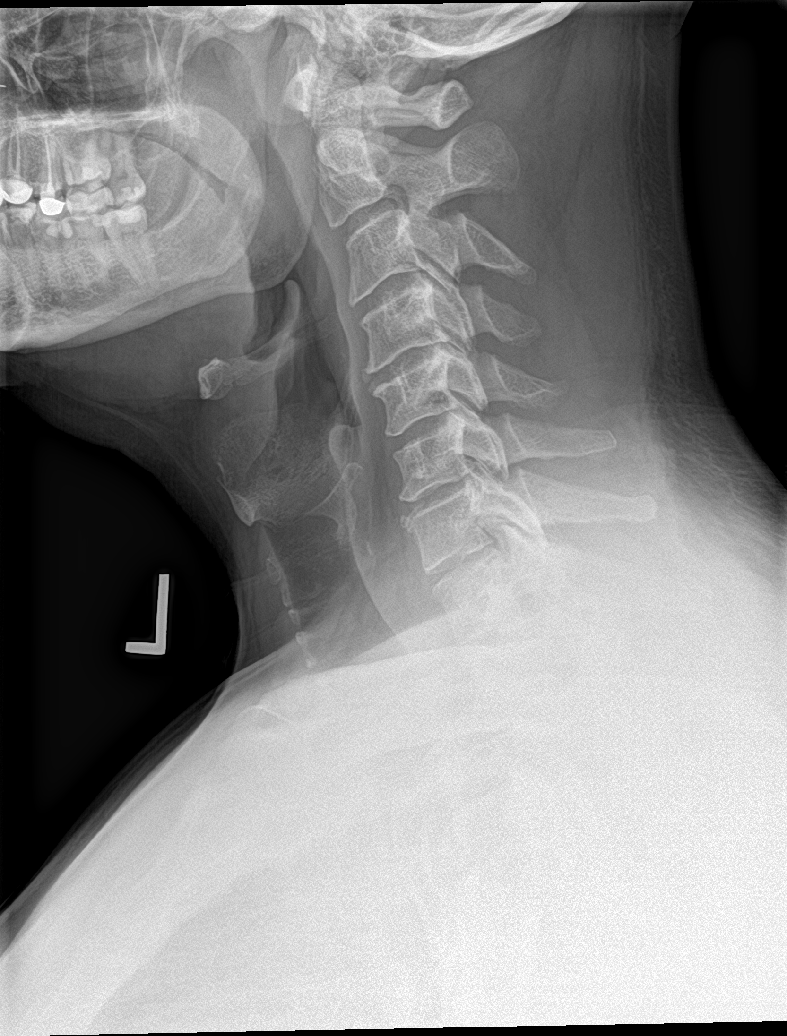

[c-spine obl (1 of 2)]
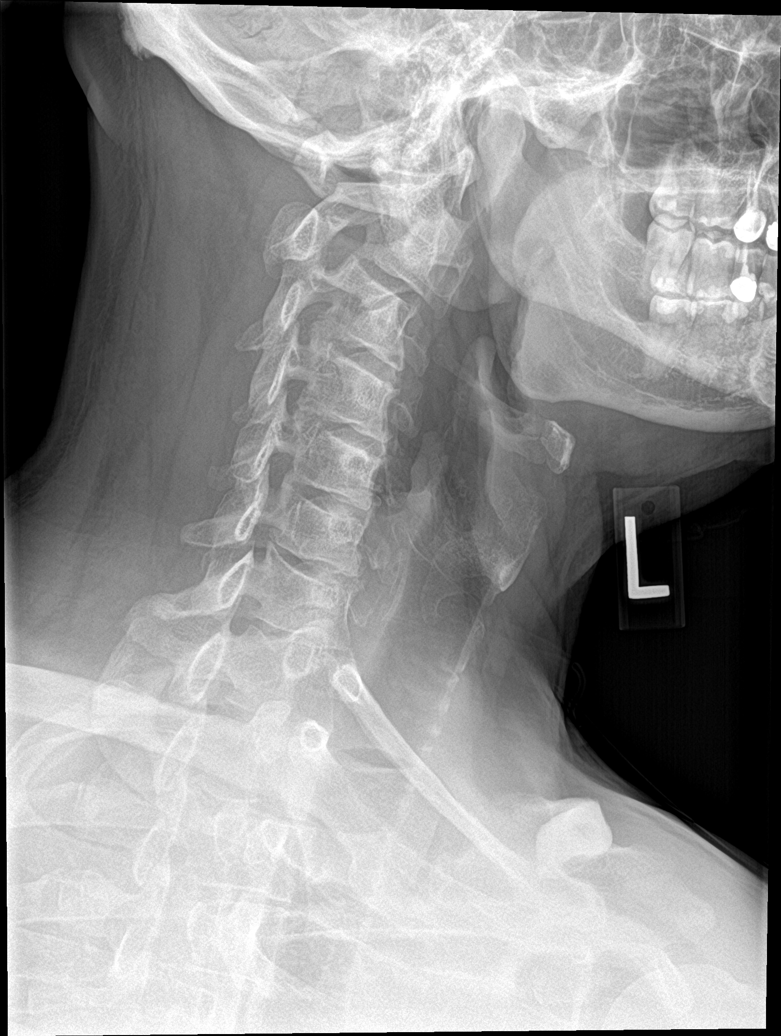

[c-spine obl (2 of 2)]
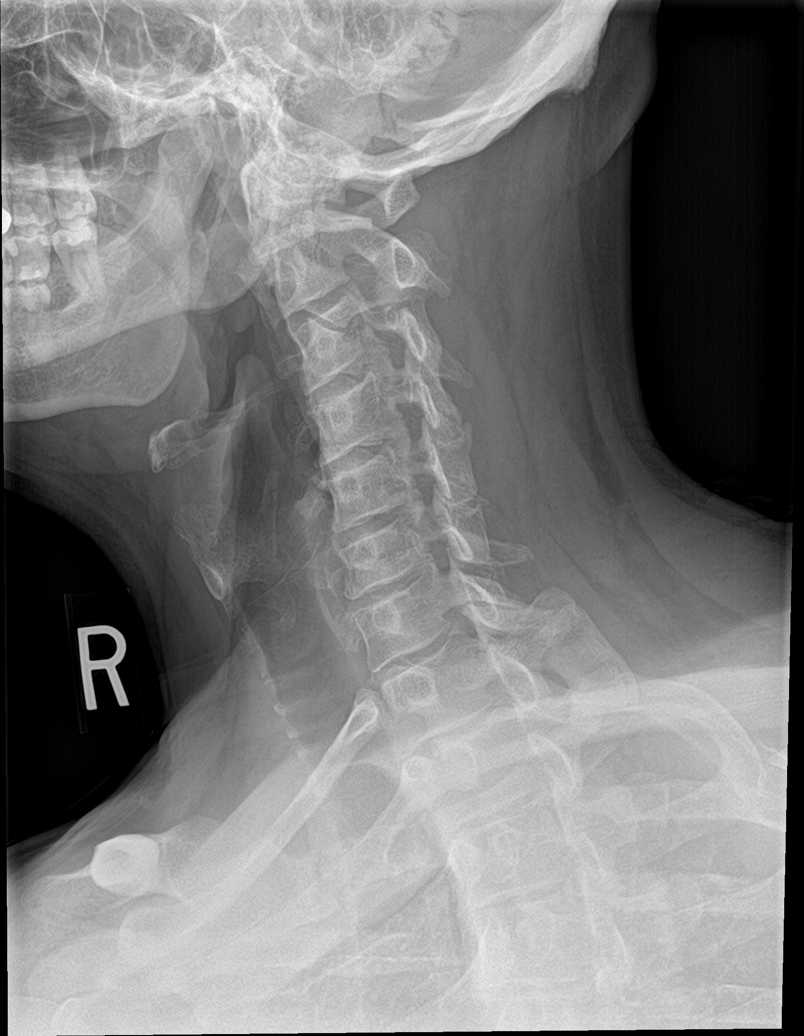

[c-spine ap]
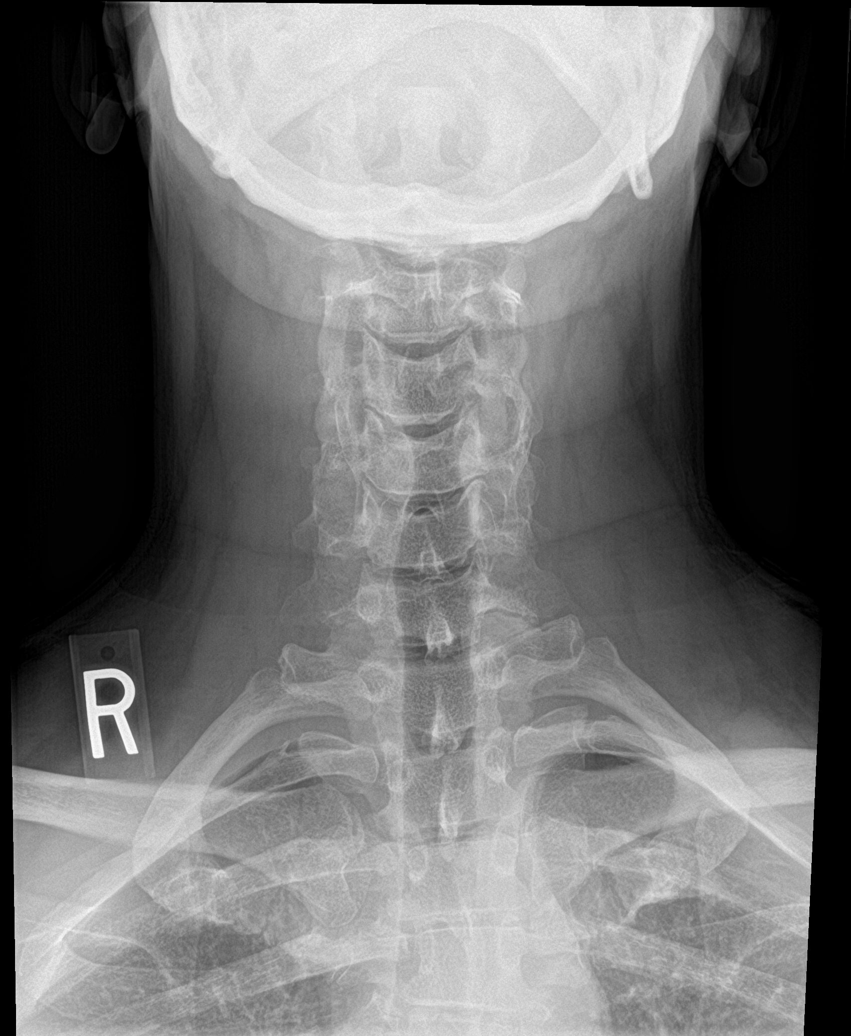

[c-spine open mouth]
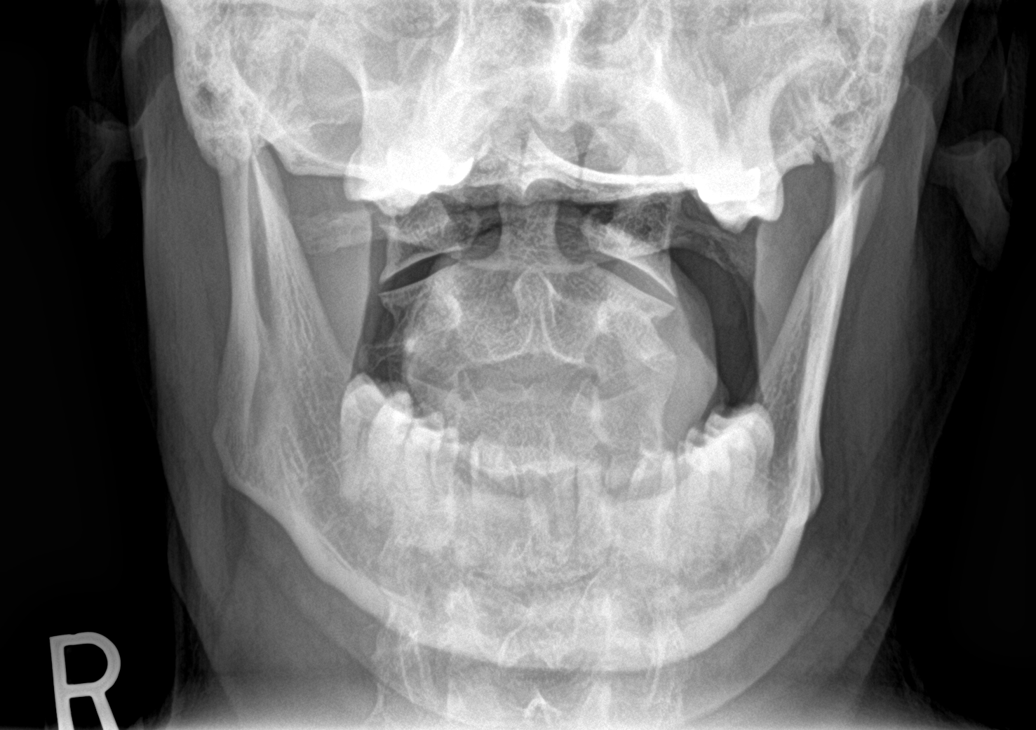

[c-spine swimmers]
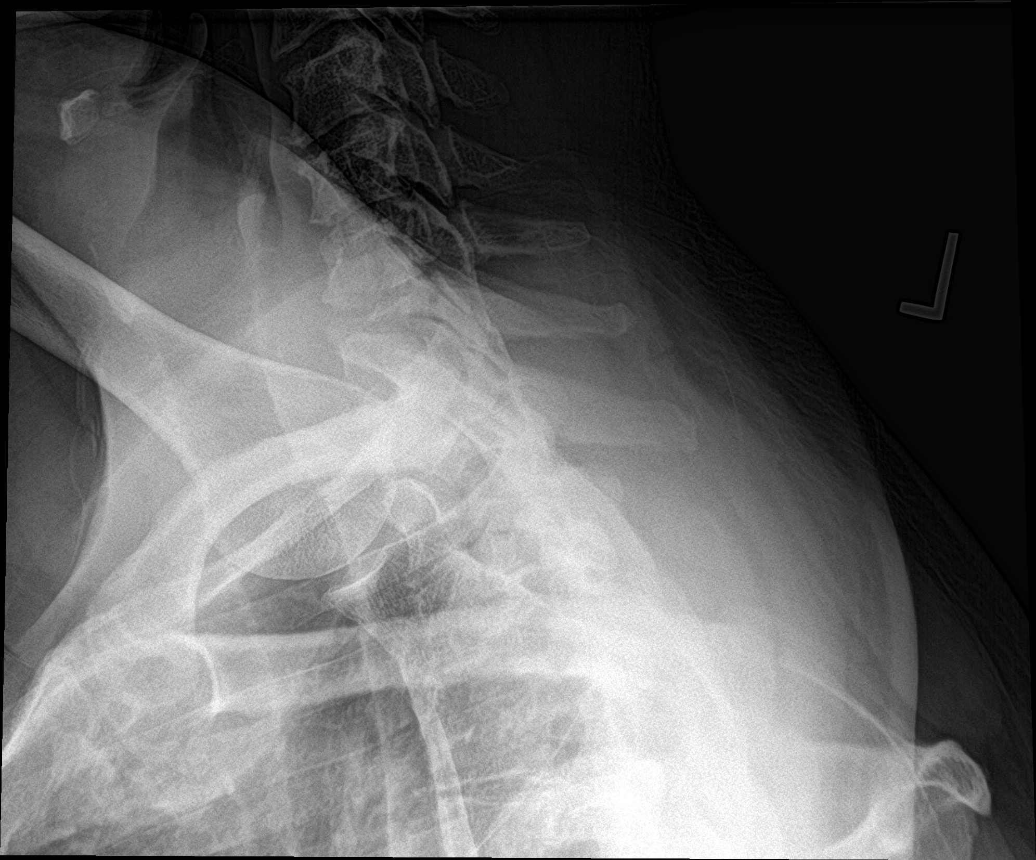

[6 of 6 positions shown; findings below may reference images not displayed]

FINDINGS: No fracture or spondylolisthesis is noted. Mild degenerative disc
disease is noted at C4-5 and C5-6 with anterior osteophyte
formation. No prevertebral soft tissue swelling is noted. Mild
bilateral neural foraminal stenosis is noted at C3-4, C4-5 and C5-6
secondary to uncovertebral spurring.
IMPRESSION: Mild multilevel degenerative disc disease. Mild bilateral neural
foraminal stenosis is noted secondary to uncovertebral spurring.

## 2021-06-19 ENCOUNTER — Other Ambulatory Visit (HOSPITAL_BASED_OUTPATIENT_CLINIC_OR_DEPARTMENT_OTHER): Payer: Self-pay

## 2021-06-19 MED FILL — Lisinopril Tab 40 MG: ORAL | 90 days supply | Qty: 90 | Fill #1 | Status: AC

## 2021-06-19 MED FILL — Atorvastatin Calcium Tab 10 MG (Base Equivalent): ORAL | 90 days supply | Qty: 90 | Fill #1 | Status: AC

## 2021-06-19 MED FILL — Pantoprazole Sodium EC Tab 40 MG (Base Equiv): ORAL | 90 days supply | Qty: 90 | Fill #1 | Status: AC

## 2021-09-17 MED FILL — Pantoprazole Sodium EC Tab 40 MG (Base Equiv): ORAL | 90 days supply | Qty: 90 | Fill #2 | Status: AC

## 2021-09-18 ENCOUNTER — Other Ambulatory Visit (HOSPITAL_BASED_OUTPATIENT_CLINIC_OR_DEPARTMENT_OTHER): Payer: Self-pay

## 2021-09-30 MED FILL — Lisinopril Tab 40 MG: ORAL | 90 days supply | Qty: 90 | Fill #2 | Status: AC

## 2021-09-30 MED FILL — Atorvastatin Calcium Tab 10 MG (Base Equivalent): ORAL | 90 days supply | Qty: 90 | Fill #2 | Status: AC

## 2021-10-02 ENCOUNTER — Other Ambulatory Visit (HOSPITAL_BASED_OUTPATIENT_CLINIC_OR_DEPARTMENT_OTHER): Payer: Self-pay

## 2021-12-12 ENCOUNTER — Other Ambulatory Visit: Payer: Self-pay | Admitting: Sports Medicine

## 2021-12-12 DIAGNOSIS — N529 Male erectile dysfunction, unspecified: Secondary | ICD-10-CM

## 2021-12-20 ENCOUNTER — Other Ambulatory Visit (HOSPITAL_BASED_OUTPATIENT_CLINIC_OR_DEPARTMENT_OTHER): Payer: Self-pay

## 2021-12-20 ENCOUNTER — Other Ambulatory Visit: Payer: Self-pay | Admitting: Sports Medicine

## 2021-12-20 DIAGNOSIS — K2100 Gastro-esophageal reflux disease with esophagitis, without bleeding: Secondary | ICD-10-CM

## 2021-12-20 MED ORDER — PANTOPRAZOLE SODIUM 40 MG PO TBEC
DELAYED_RELEASE_TABLET | Freq: Every day | ORAL | 3 refills | Status: DC
  Filled 2021-12-20: qty 90, 90d supply, fill #0
  Filled 2022-03-25: qty 90, 90d supply, fill #1
  Filled 2022-03-30: qty 90, 90d supply, fill #0
  Filled 2022-06-29: qty 90, 90d supply, fill #1
  Filled 2022-08-30 – 2022-09-04 (×2): qty 90, 90d supply, fill #2
  Filled 2022-09-04: qty 30, 30d supply, fill #2
  Filled 2022-09-25: qty 90, 90d supply, fill #2

## 2022-01-03 ENCOUNTER — Other Ambulatory Visit: Payer: Self-pay | Admitting: Sports Medicine

## 2022-01-03 DIAGNOSIS — I10 Essential (primary) hypertension: Secondary | ICD-10-CM

## 2022-01-03 DIAGNOSIS — E785 Hyperlipidemia, unspecified: Secondary | ICD-10-CM

## 2022-01-04 ENCOUNTER — Other Ambulatory Visit (HOSPITAL_BASED_OUTPATIENT_CLINIC_OR_DEPARTMENT_OTHER): Payer: Self-pay

## 2022-01-04 MED ORDER — ATORVASTATIN CALCIUM 10 MG PO TABS
ORAL_TABLET | Freq: Every day | ORAL | 3 refills | Status: DC
  Filled 2022-01-04: qty 90, 90d supply, fill #0
  Filled 2022-03-30: qty 90, 90d supply, fill #1
  Filled 2022-03-30: qty 90, 90d supply, fill #0
  Filled 2022-06-29: qty 90, 90d supply, fill #1
  Filled 2022-09-25: qty 90, 90d supply, fill #2

## 2022-01-04 MED ORDER — LISINOPRIL 40 MG PO TABS
ORAL_TABLET | Freq: Every day | ORAL | 3 refills | Status: DC
  Filled 2022-01-04 – 2022-03-30 (×2): qty 90, 90d supply, fill #0
  Filled 2022-03-30 – 2022-06-29 (×2): qty 90, 90d supply, fill #1
  Filled 2022-09-25: qty 90, 90d supply, fill #2

## 2022-01-11 ENCOUNTER — Other Ambulatory Visit: Payer: Self-pay | Admitting: Sports Medicine

## 2022-01-11 DIAGNOSIS — N529 Male erectile dysfunction, unspecified: Secondary | ICD-10-CM

## 2022-01-12 ENCOUNTER — Encounter: Payer: No Typology Code available for payment source | Admitting: Sports Medicine

## 2022-01-15 ENCOUNTER — Other Ambulatory Visit: Payer: Self-pay

## 2022-01-15 ENCOUNTER — Ambulatory Visit (INDEPENDENT_AMBULATORY_CARE_PROVIDER_SITE_OTHER): Payer: No Typology Code available for payment source | Admitting: Sports Medicine

## 2022-01-15 VITALS — BP 126/80 | HR 70 | Wt 231.4 lb

## 2022-01-15 DIAGNOSIS — N529 Male erectile dysfunction, unspecified: Secondary | ICD-10-CM

## 2022-01-15 DIAGNOSIS — Z0000 Encounter for general adult medical examination without abnormal findings: Secondary | ICD-10-CM | POA: Diagnosis not present

## 2022-01-15 DIAGNOSIS — I10 Essential (primary) hypertension: Secondary | ICD-10-CM

## 2022-01-15 DIAGNOSIS — E785 Hyperlipidemia, unspecified: Secondary | ICD-10-CM

## 2022-01-15 MED ORDER — TADALAFIL 5 MG PO TABS: 5.0000 mg | ORAL_TABLET | Freq: Every day | ORAL | 3 refills | Status: DC | PRN

## 2022-01-15 NOTE — Assessment & Plan Note (Signed)
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

## 2022-01-15 NOTE — Assessment & Plan Note (Signed)
Rechecking lipids today, he did have some coffee with a bit of creamer so if triglycerides are elevated we will defer treatment.

## 2022-01-15 NOTE — Assessment & Plan Note (Signed)
Overall doing well, refilling tadalafil. He has noted improvements in obstructive uropathy type symptoms as well.

## 2022-01-15 NOTE — Progress Notes (Signed)
Subjective:    CC: Annual Physical Exam  HPI:  This patient is here for their annual physical  I reviewed the past medical history, family history, social history, surgical history, and allergies today and no changes were needed.  Please see the problem list section below in epic for further details.  Past Medical History: Past Medical History:  Diagnosis Date   Hypertension    Obesity    Past Surgical History: Past Surgical History:  Procedure Laterality Date   ADENOIDECTOMY     TONSILLECTOMY     VASECTOMY     Social History: Social History   Socioeconomic History   Marital status: Divorced    Spouse name: Not on file   Number of children: Not on file   Years of education: Not on file   Highest education level: Not on file  Occupational History   Not on file  Tobacco Use   Smoking status: Never   Smokeless tobacco: Current    Types: Chew  Substance and Sexual Activity   Alcohol use: Yes    Alcohol/week: 2.0 standard drinks    Types: 2 drink(s) per week    Comment: daily   Drug use: No   Sexual activity: Not on file  Other Topics Concern   Not on file  Social History Narrative   Not on file   Social Determinants of Health   Financial Resource Strain: Not on file  Food Insecurity: Not on file  Transportation Needs: Not on file  Physical Activity: Not on file  Stress: Not on file  Social Connections: Not on file   Family History: Family History  Problem Relation Age of Onset   Depression Mother    Alcohol abuse Mother    Hyperlipidemia Father    Hypertension Father    Alcohol abuse Father    Depression Father    Heart attack Paternal Grandfather 22   Cancer Maternal Grandfather        prostate and bladder CA    Allergies: No Known Allergies Medications: See med rec.  Review of Systems: No headache, visual changes, nausea, vomiting, diarrhea, constipation, dizziness, abdominal pain, skin rash, fevers, chills, night sweats, swollen lymph nodes,  weight loss, chest pain, body aches, joint swelling, muscle aches, shortness of breath, mood changes, visual or auditory hallucinations.  Objective:    General: Well Developed, well nourished, and in no acute distress.  Neuro: Alert and oriented x3, extra-ocular muscles intact, sensation grossly intact. Cranial nerves II through XII are intact, motor, sensory, and coordinative functions are all intact. HEENT: Normocephalic, atraumatic, pupils equal round reactive to light, neck supple, no masses, no lymphadenopathy, thyroid nonpalpable. Oropharynx, nasopharynx, external ear canals are unremarkable. Skin: Warm and dry, no rashes noted.  Cardiac: Regular rate and rhythm, no murmurs rubs or gallops.  Respiratory: Clear to auscultation bilaterally. Not using accessory muscles, speaking in full sentences.  Abdominal: Soft, nontender, nondistended, positive bowel sounds, no masses, no organomegaly.  Musculoskeletal: Shoulder, elbow, wrist, hip, knee, ankle stable, and with full range of motion.  Impression and Recommendations:    The patient was counselled, risk factors were discussed, anticipatory guidance given.  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   Erectile dysfunction Overall doing well, refilling tadalafil. He has noted improvements in obstructive uropathy type symptoms as well.  Essential hypertension, benign Controlled, no change in plan.  Hyperlipidemia LDL goal <100 Rechecking lipids today, he did  have some coffee with a bit of creamer so if triglycerides are elevated we will defer treatment.   ___________________________________________ Ihor Austin. Benjamin Stain, M.D., ABFM., CAQSM. Primary Care and Sports Medicine [place] [facility]  Adjunct Professor of Family Medicine  [employer]

## 2022-01-15 NOTE — Assessment & Plan Note (Signed)
Controlled, no change in plan. 

## 2022-01-16 LAB — TSH: TSH: 0.88 mIU/L (ref 0.40–4.50)

## 2022-01-16 LAB — CBC
HCT: 46.4 % (ref 38.5–50.0)
Hemoglobin: 16.1 g/dL (ref 13.2–17.1)
MCH: 30.5 pg (ref 27.0–33.0)
MCHC: 34.7 g/dL (ref 32.0–36.0)
MCV: 87.9 fL (ref 80.0–100.0)
MPV: 10.9 fL (ref 7.5–12.5)
Platelets: 225 10*3/uL (ref 140–400)
RBC: 5.28 10*6/uL (ref 4.20–5.80)
RDW: 12.2 % (ref 11.0–15.0)
WBC: 5 10*3/uL (ref 3.8–10.8)

## 2022-01-16 LAB — HEMOGLOBIN A1C
Hgb A1c MFr Bld: 5.2 % of total Hgb (ref <5.7)
Mean Plasma Glucose: 103 mg/dL
eAG (mmol/L): 5.7 mmol/L

## 2022-01-16 LAB — COMPREHENSIVE METABOLIC PANEL
AG Ratio: 1.6 (calc) (ref 1.0–2.5)
ALT: 20 U/L (ref 9–46)
AST: 15 U/L (ref 10–40)
Albumin: 4.2 g/dL (ref 3.6–5.1)
Alkaline phosphatase (APISO): 70 U/L (ref 36–130)
BUN: 15 mg/dL (ref 7–25)
CO2: 26 mmol/L (ref 20–32)
Calcium: 9.2 mg/dL (ref 8.6–10.3)
Chloride: 102 mmol/L (ref 98–110)
Creat: 1.28 mg/dL (ref 0.60–1.29)
Globulin: 2.6 g/dL (calc) (ref 1.9–3.7)
Glucose, Bld: 93 mg/dL (ref 65–99)
Potassium: 4.4 mmol/L (ref 3.5–5.3)
Sodium: 136 mmol/L (ref 135–146)
Total Bilirubin: 1 mg/dL (ref 0.2–1.2)
Total Protein: 6.8 g/dL (ref 6.1–8.1)

## 2022-01-16 LAB — LIPID PANEL
Cholesterol: 171 mg/dL (ref <200)
HDL: 58 mg/dL (ref >=40)
LDL Cholesterol (Calc): 87 mg/dL (calc)
Non-HDL Cholesterol (Calc): 113 mg/dL (calc) (ref <130)
Total CHOL/HDL Ratio: 2.9 (calc) (ref <5.0)
Triglycerides: 163 mg/dL — ABNORMAL HIGH (ref <150)

## 2022-03-26 ENCOUNTER — Other Ambulatory Visit (HOSPITAL_BASED_OUTPATIENT_CLINIC_OR_DEPARTMENT_OTHER): Payer: Self-pay

## 2022-03-30 ENCOUNTER — Encounter: Payer: Self-pay | Admitting: Sports Medicine

## 2022-03-30 ENCOUNTER — Other Ambulatory Visit (HOSPITAL_BASED_OUTPATIENT_CLINIC_OR_DEPARTMENT_OTHER): Payer: Self-pay

## 2022-06-04 ENCOUNTER — Other Ambulatory Visit (HOSPITAL_BASED_OUTPATIENT_CLINIC_OR_DEPARTMENT_OTHER): Payer: Self-pay

## 2022-06-04 ENCOUNTER — Encounter (INDEPENDENT_AMBULATORY_CARE_PROVIDER_SITE_OTHER): Payer: No Typology Code available for payment source | Admitting: Sports Medicine

## 2022-06-04 DIAGNOSIS — N529 Male erectile dysfunction, unspecified: Secondary | ICD-10-CM

## 2022-06-04 MED ORDER — TADALAFIL 5 MG PO TABS
5.0000 mg | ORAL_TABLET | Freq: Every day | ORAL | 3 refills | Status: DC | PRN
Start: 2022-06-04 — End: 2023-05-03
  Filled 2022-06-04: qty 60, 30d supply, fill #0
  Filled 2022-07-14: qty 60, 30d supply, fill #1
  Filled 2022-08-17: qty 60, 30d supply, fill #2
  Filled 2022-09-25: qty 60, 30d supply, fill #3
  Filled 2022-11-03 – 2022-11-05 (×2): qty 60, 30d supply, fill #4
  Filled 2022-11-07: qty 60, 30d supply, fill #0
  Filled 2022-12-13: qty 60, 30d supply, fill #1
  Filled 2023-01-25: qty 60, 30d supply, fill #2
  Filled 2023-03-21: qty 60, 30d supply, fill #3

## 2022-06-04 NOTE — Telephone Encounter (Signed)
I spent 5 total minutes of online digital evaluation and management services in this patient-initiated request for online care. 

## 2022-06-29 ENCOUNTER — Other Ambulatory Visit (HOSPITAL_BASED_OUTPATIENT_CLINIC_OR_DEPARTMENT_OTHER): Payer: Self-pay

## 2022-07-16 ENCOUNTER — Other Ambulatory Visit (HOSPITAL_BASED_OUTPATIENT_CLINIC_OR_DEPARTMENT_OTHER): Payer: Self-pay

## 2022-08-17 ENCOUNTER — Other Ambulatory Visit (HOSPITAL_BASED_OUTPATIENT_CLINIC_OR_DEPARTMENT_OTHER): Payer: Self-pay

## 2022-08-30 ENCOUNTER — Other Ambulatory Visit (HOSPITAL_BASED_OUTPATIENT_CLINIC_OR_DEPARTMENT_OTHER): Payer: Self-pay

## 2022-08-30 ENCOUNTER — Encounter (HOSPITAL_BASED_OUTPATIENT_CLINIC_OR_DEPARTMENT_OTHER): Payer: Self-pay | Admitting: Pharmacist

## 2022-09-04 ENCOUNTER — Other Ambulatory Visit (HOSPITAL_BASED_OUTPATIENT_CLINIC_OR_DEPARTMENT_OTHER): Payer: Self-pay

## 2022-09-25 ENCOUNTER — Other Ambulatory Visit (HOSPITAL_BASED_OUTPATIENT_CLINIC_OR_DEPARTMENT_OTHER): Payer: Self-pay

## 2022-11-05 ENCOUNTER — Other Ambulatory Visit: Payer: Self-pay

## 2022-11-05 ENCOUNTER — Other Ambulatory Visit (HOSPITAL_BASED_OUTPATIENT_CLINIC_OR_DEPARTMENT_OTHER): Payer: Self-pay

## 2022-11-07 ENCOUNTER — Other Ambulatory Visit (HOSPITAL_BASED_OUTPATIENT_CLINIC_OR_DEPARTMENT_OTHER): Payer: Self-pay

## 2022-12-13 ENCOUNTER — Other Ambulatory Visit (HOSPITAL_BASED_OUTPATIENT_CLINIC_OR_DEPARTMENT_OTHER): Payer: Self-pay

## 2022-12-13 ENCOUNTER — Encounter: Payer: Self-pay | Admitting: Sports Medicine

## 2022-12-30 ENCOUNTER — Other Ambulatory Visit: Payer: Self-pay | Admitting: Sports Medicine

## 2022-12-30 DIAGNOSIS — K2100 Gastro-esophageal reflux disease with esophagitis, without bleeding: Secondary | ICD-10-CM

## 2022-12-30 DIAGNOSIS — E785 Hyperlipidemia, unspecified: Secondary | ICD-10-CM

## 2022-12-30 DIAGNOSIS — I10 Essential (primary) hypertension: Secondary | ICD-10-CM

## 2022-12-31 ENCOUNTER — Other Ambulatory Visit (HOSPITAL_BASED_OUTPATIENT_CLINIC_OR_DEPARTMENT_OTHER): Payer: Self-pay

## 2022-12-31 MED ORDER — ATORVASTATIN CALCIUM 10 MG PO TABS
10.0000 mg | ORAL_TABLET | Freq: Every day | ORAL | 0 refills | Status: DC
  Filled 2022-12-31: qty 90, 90d supply, fill #0

## 2022-12-31 MED ORDER — LISINOPRIL 40 MG PO TABS
40.0000 mg | ORAL_TABLET | Freq: Every day | ORAL | 0 refills | Status: DC
  Filled 2022-12-31: qty 40, 40d supply, fill #0
  Filled 2022-12-31: qty 50, 50d supply, fill #0
  Filled 2022-12-31: qty 40, 40d supply, fill #0
  Filled 2022-12-31: qty 50, 50d supply, fill #0

## 2022-12-31 MED ORDER — PANTOPRAZOLE SODIUM 40 MG PO TBEC
40.0000 mg | DELAYED_RELEASE_TABLET | Freq: Every day | ORAL | 0 refills | Status: DC
  Filled 2022-12-31: qty 90, 90d supply, fill #0

## 2023-01-07 DIAGNOSIS — M9901 Segmental and somatic dysfunction of cervical region: Secondary | ICD-10-CM | POA: Diagnosis not present

## 2023-01-07 DIAGNOSIS — M7918 Myalgia, other site: Secondary | ICD-10-CM | POA: Diagnosis not present

## 2023-01-07 DIAGNOSIS — M9902 Segmental and somatic dysfunction of thoracic region: Secondary | ICD-10-CM | POA: Diagnosis not present

## 2023-01-07 DIAGNOSIS — M542 Cervicalgia: Secondary | ICD-10-CM | POA: Diagnosis not present

## 2023-01-07 DIAGNOSIS — M546 Pain in thoracic spine: Secondary | ICD-10-CM | POA: Diagnosis not present

## 2023-01-17 ENCOUNTER — Encounter: Payer: Self-pay | Admitting: Sports Medicine

## 2023-01-17 ENCOUNTER — Ambulatory Visit (INDEPENDENT_AMBULATORY_CARE_PROVIDER_SITE_OTHER): Payer: 59 | Admitting: Sports Medicine

## 2023-01-17 VITALS — BP 125/73 | HR 65 | Ht 70.0 in | Wt <= 1120 oz

## 2023-01-17 DIAGNOSIS — E785 Hyperlipidemia, unspecified: Secondary | ICD-10-CM

## 2023-01-17 DIAGNOSIS — E559 Vitamin D deficiency, unspecified: Secondary | ICD-10-CM | POA: Diagnosis not present

## 2023-01-17 DIAGNOSIS — Z0000 Encounter for general adult medical examination without abnormal findings: Secondary | ICD-10-CM | POA: Diagnosis not present

## 2023-01-17 DIAGNOSIS — Z23 Encounter for immunization: Secondary | ICD-10-CM

## 2023-01-17 DIAGNOSIS — N139 Obstructive and reflux uropathy, unspecified: Secondary | ICD-10-CM | POA: Diagnosis not present

## 2023-01-17 DIAGNOSIS — Z1211 Encounter for screening for malignant neoplasm of colon: Secondary | ICD-10-CM

## 2023-01-17 NOTE — Assessment & Plan Note (Signed)
Annual physical as above, he got his flu shot last fall, Shingrix today, return in 2 to 6 months for Shingrix #2. Ordering Cologuard.

## 2023-01-17 NOTE — Progress Notes (Signed)
Subjective:    CC: Annual Physical Exam  HPI:  This patient is here for their annual physical  I reviewed the past medical history, family history, social history, surgical history, and allergies today and no changes were needed.  Please see the problem list section below in epic for further details.  Past Medical History: Past Medical History:  Diagnosis Date   Hypertension    Obesity    Past Surgical History: Past Surgical History:  Procedure Laterality Date   ADENOIDECTOMY     TONSILLECTOMY     VASECTOMY     Social History: Social History   Socioeconomic History   Marital status: Divorced    Spouse name: Not on file   Number of children: Not on file   Years of education: Not on file   Highest education level: Not on file  Occupational History   Not on file  Tobacco Use   Smoking status: Never   Smokeless tobacco: Current    Types: Chew  Substance and Sexual Activity   Alcohol use: Yes    Alcohol/week: 2.0 standard drinks of alcohol    Types: 2 drink(s) per week    Comment: daily   Drug use: No   Sexual activity: Not on file  Other Topics Concern   Not on file  Social History Narrative   Not on file   Social Determinants of Health   Financial Resource Strain: Not on file  Food Insecurity: Not on file  Transportation Needs: Not on file  Physical Activity: Not on file  Stress: Not on file  Social Connections: Not on file   Family History: Family History  Problem Relation Age of Onset   Depression Mother    Alcohol abuse Mother    Hyperlipidemia Father    Hypertension Father    Alcohol abuse Father    Depression Father    Heart attack Paternal Grandfather 31   Cancer Maternal Grandfather        prostate and bladder CA    Allergies: No Known Allergies Medications: See med rec.  Review of Systems: No headache, visual changes, nausea, vomiting, diarrhea, constipation, dizziness, abdominal pain, skin rash, fevers, chills, night sweats, swollen  lymph nodes, weight loss, chest pain, body aches, joint swelling, muscle aches, shortness of breath, mood changes, visual or auditory hallucinations.  Objective:    General: Well Developed, well nourished, and in no acute distress.  Neuro: Alert and oriented x3, extra-ocular muscles intact, sensation grossly intact. Cranial nerves II through XII are intact, motor, sensory, and coordinative functions are all intact. HEENT: Normocephalic, atraumatic, pupils equal round reactive to light, neck supple, no masses, no lymphadenopathy, thyroid nonpalpable. Oropharynx, nasopharynx, external ear canals are unremarkable. Skin: Warm and dry, no rashes noted.  Cardiac: Regular rate and rhythm, no murmurs rubs or gallops.  Respiratory: Clear to auscultation bilaterally. Not using accessory muscles, speaking in full sentences.  Abdominal: Soft, nontender, nondistended, positive bowel sounds, no masses, no organomegaly.  Musculoskeletal: Shoulder, elbow, wrist, hip, knee, ankle stable, and with full range of motion.  Impression and Recommendations:    The patient was counselled, risk factors were discussed, anticipatory guidance given.  Annual physical exam Annual physical as above, he got his flu shot last fall, Shingrix today, return in 2 to 6 months for Shingrix #2. Ordering Cologuard.   ____________________________________________ Gwen Her. Dianah Field, M.D., ABFM., CAQSM., AME. Primary Care and Sports Medicine [place] [facility]  Adjunct Professor of [place] [employer] [facility]  of Medicine  Risk manager

## 2023-01-18 LAB — HEMOGLOBIN A1C
Est. average glucose Bld gHb Est-mCnc: 111 mg/dL
Hgb A1c MFr Bld: 5.5 % (ref 4.8–5.6)

## 2023-01-18 LAB — COMPREHENSIVE METABOLIC PANEL
ALT: 20 IU/L (ref 0–44)
AST: 14 IU/L (ref 0–40)
Albumin/Globulin Ratio: 2.1 (ref 1.2–2.2)
Albumin: 4.5 g/dL (ref 4.1–5.1)
Alkaline Phosphatase: 72 IU/L (ref 44–121)
BUN/Creatinine Ratio: 14 (ref 9–20)
BUN: 16 mg/dL (ref 6–24)
Bilirubin Total: 0.6 mg/dL (ref 0.0–1.2)
CO2: 21 mmol/L (ref 20–29)
Calcium: 9.3 mg/dL (ref 8.7–10.2)
Chloride: 100 mmol/L (ref 96–106)
Creatinine, Ser: 1.13 mg/dL (ref 0.76–1.27)
Globulin, Total: 2.1 g/dL (ref 1.5–4.5)
Glucose: 98 mg/dL (ref 70–99)
Potassium: 4.6 mmol/L (ref 3.5–5.2)
Sodium: 135 mmol/L (ref 134–144)
Total Protein: 6.6 g/dL (ref 6.0–8.5)
eGFR: 79 mL/min/{1.73_m2} (ref >59)

## 2023-01-18 LAB — LIPID PANEL
Chol/HDL Ratio: 2.6 ratio (ref 0.0–5.0)
Cholesterol, Total: 148 mg/dL (ref 100–199)
HDL: 57 mg/dL (ref >39)
LDL Chol Calc (NIH): 77 mg/dL (ref 0–99)
Triglycerides: 71 mg/dL (ref 0–149)
VLDL Cholesterol Cal: 14 mg/dL (ref 5–40)

## 2023-01-18 LAB — PSA, TOTAL AND FREE
PSA, Free Pct: 26.1 %
PSA, Free: 0.47 ng/mL
Prostate Specific Ag, Serum: 1.8 ng/mL (ref 0.0–4.0)

## 2023-01-18 LAB — CBC
Hematocrit: 45.6 % (ref 37.5–51.0)
Hemoglobin: 15.4 g/dL (ref 13.0–17.7)
MCH: 29.5 pg (ref 26.6–33.0)
MCHC: 33.8 g/dL (ref 31.5–35.7)
MCV: 87 fL (ref 79–97)
Platelets: 246 10*3/uL (ref 150–450)
RBC: 5.22 x10E6/uL (ref 4.14–5.80)
RDW: 12.5 % (ref 11.6–15.4)
WBC: 4.9 10*3/uL (ref 3.4–10.8)

## 2023-01-18 LAB — TSH: TSH: 0.871 u[IU]/mL (ref 0.450–4.500)

## 2023-01-18 LAB — VITAMIN D 25 HYDROXY (VIT D DEFICIENCY, FRACTURES): Vit D, 25-Hydroxy: 44 ng/mL (ref 30.0–100.0)

## 2023-01-25 ENCOUNTER — Other Ambulatory Visit (HOSPITAL_BASED_OUTPATIENT_CLINIC_OR_DEPARTMENT_OTHER): Payer: Self-pay

## 2023-03-22 ENCOUNTER — Other Ambulatory Visit (HOSPITAL_BASED_OUTPATIENT_CLINIC_OR_DEPARTMENT_OTHER): Payer: Self-pay

## 2023-04-04 ENCOUNTER — Other Ambulatory Visit: Payer: Self-pay | Admitting: Sports Medicine

## 2023-04-04 ENCOUNTER — Other Ambulatory Visit (HOSPITAL_BASED_OUTPATIENT_CLINIC_OR_DEPARTMENT_OTHER): Payer: Self-pay

## 2023-04-04 DIAGNOSIS — E785 Hyperlipidemia, unspecified: Secondary | ICD-10-CM

## 2023-04-04 DIAGNOSIS — I10 Essential (primary) hypertension: Secondary | ICD-10-CM

## 2023-04-04 DIAGNOSIS — K2100 Gastro-esophageal reflux disease with esophagitis, without bleeding: Secondary | ICD-10-CM

## 2023-04-06 ENCOUNTER — Other Ambulatory Visit (HOSPITAL_BASED_OUTPATIENT_CLINIC_OR_DEPARTMENT_OTHER): Payer: Self-pay

## 2023-04-06 ENCOUNTER — Other Ambulatory Visit (HOSPITAL_COMMUNITY): Payer: Self-pay

## 2023-04-10 ENCOUNTER — Other Ambulatory Visit (HOSPITAL_BASED_OUTPATIENT_CLINIC_OR_DEPARTMENT_OTHER): Payer: Self-pay

## 2023-04-10 ENCOUNTER — Encounter: Payer: Self-pay | Admitting: Sports Medicine

## 2023-04-10 MED ORDER — ATORVASTATIN CALCIUM 10 MG PO TABS
10.0000 mg | ORAL_TABLET | Freq: Every day | ORAL | 2 refills | Status: DC
  Filled 2023-04-10: qty 90, 90d supply, fill #0
  Filled 2023-06-30: qty 90, 90d supply, fill #1
  Filled 2023-10-02: qty 90, 90d supply, fill #2

## 2023-04-10 MED ORDER — PANTOPRAZOLE SODIUM 40 MG PO TBEC
40.0000 mg | DELAYED_RELEASE_TABLET | Freq: Every day | ORAL | 2 refills | Status: DC
  Filled 2023-04-10: qty 90, 90d supply, fill #0
  Filled 2023-06-30: qty 90, 90d supply, fill #1
  Filled 2023-10-02: qty 90, 90d supply, fill #2

## 2023-04-10 MED ORDER — LISINOPRIL 40 MG PO TABS
40.0000 mg | ORAL_TABLET | Freq: Every day | ORAL | 2 refills | Status: DC
  Filled 2023-04-10: qty 90, 90d supply, fill #0
  Filled 2023-06-30: qty 90, 90d supply, fill #1
  Filled 2023-10-02: qty 90, 90d supply, fill #2

## 2023-04-17 ENCOUNTER — Ambulatory Visit: Payer: 59

## 2023-05-02 ENCOUNTER — Other Ambulatory Visit: Payer: Self-pay | Admitting: Sports Medicine

## 2023-05-02 DIAGNOSIS — N529 Male erectile dysfunction, unspecified: Secondary | ICD-10-CM

## 2023-05-03 ENCOUNTER — Other Ambulatory Visit (HOSPITAL_BASED_OUTPATIENT_CLINIC_OR_DEPARTMENT_OTHER): Payer: Self-pay

## 2023-05-03 ENCOUNTER — Encounter: Payer: Self-pay | Admitting: Sports Medicine

## 2023-05-03 DIAGNOSIS — N529 Male erectile dysfunction, unspecified: Secondary | ICD-10-CM

## 2023-05-03 MED ORDER — TADALAFIL 5 MG PO TABS
5.0000 mg | ORAL_TABLET | Freq: Every day | ORAL | 1 refills | Status: DC | PRN
  Filled 2023-05-03 (×2): qty 120, 60d supply, fill #0
  Filled 2023-05-03 – 2023-05-04 (×2): qty 60, 30d supply, fill #0
  Filled 2023-05-04: qty 120, 60d supply, fill #0
  Filled 2023-06-07 – 2023-06-08 (×2): qty 60, 30d supply, fill #1
  Filled 2023-07-16: qty 60, 30d supply, fill #2
  Filled 2023-08-27: qty 30, 15d supply, fill #3
  Filled 2023-09-14: qty 30, 15d supply, fill #4

## 2023-05-04 ENCOUNTER — Other Ambulatory Visit (HOSPITAL_BASED_OUTPATIENT_CLINIC_OR_DEPARTMENT_OTHER): Payer: Self-pay

## 2023-06-07 ENCOUNTER — Other Ambulatory Visit (HOSPITAL_BASED_OUTPATIENT_CLINIC_OR_DEPARTMENT_OTHER): Payer: Self-pay

## 2023-06-08 ENCOUNTER — Other Ambulatory Visit (HOSPITAL_BASED_OUTPATIENT_CLINIC_OR_DEPARTMENT_OTHER): Payer: Self-pay

## 2023-07-16 ENCOUNTER — Other Ambulatory Visit (HOSPITAL_BASED_OUTPATIENT_CLINIC_OR_DEPARTMENT_OTHER): Payer: Self-pay

## 2023-08-28 ENCOUNTER — Other Ambulatory Visit (HOSPITAL_BASED_OUTPATIENT_CLINIC_OR_DEPARTMENT_OTHER): Payer: Self-pay

## 2023-09-14 ENCOUNTER — Other Ambulatory Visit (HOSPITAL_BASED_OUTPATIENT_CLINIC_OR_DEPARTMENT_OTHER): Payer: Self-pay

## 2023-10-07 ENCOUNTER — Other Ambulatory Visit (HOSPITAL_BASED_OUTPATIENT_CLINIC_OR_DEPARTMENT_OTHER): Payer: Self-pay

## 2023-10-07 ENCOUNTER — Other Ambulatory Visit: Payer: Self-pay | Admitting: Sports Medicine

## 2023-10-07 DIAGNOSIS — N529 Male erectile dysfunction, unspecified: Secondary | ICD-10-CM

## 2023-10-07 MED ORDER — TADALAFIL 5 MG PO TABS
5.0000 mg | ORAL_TABLET | Freq: Every day | ORAL | 1 refills | Status: DC | PRN
  Filled 2023-10-07: qty 60, 30d supply, fill #0
  Filled 2023-11-14: qty 60, 30d supply, fill #1
  Filled 2023-12-22: qty 60, 30d supply, fill #2

## 2023-11-14 ENCOUNTER — Other Ambulatory Visit (HOSPITAL_BASED_OUTPATIENT_CLINIC_OR_DEPARTMENT_OTHER): Payer: Self-pay

## 2023-12-23 ENCOUNTER — Other Ambulatory Visit (HOSPITAL_BASED_OUTPATIENT_CLINIC_OR_DEPARTMENT_OTHER): Payer: Self-pay

## 2023-12-30 ENCOUNTER — Other Ambulatory Visit: Payer: Self-pay | Admitting: Sports Medicine

## 2023-12-30 ENCOUNTER — Other Ambulatory Visit (HOSPITAL_BASED_OUTPATIENT_CLINIC_OR_DEPARTMENT_OTHER): Payer: Self-pay

## 2023-12-30 DIAGNOSIS — E785 Hyperlipidemia, unspecified: Secondary | ICD-10-CM

## 2023-12-30 DIAGNOSIS — I10 Essential (primary) hypertension: Secondary | ICD-10-CM

## 2023-12-30 DIAGNOSIS — K2100 Gastro-esophageal reflux disease with esophagitis, without bleeding: Secondary | ICD-10-CM

## 2023-12-30 MED ORDER — ATORVASTATIN CALCIUM 10 MG PO TABS
10.0000 mg | ORAL_TABLET | Freq: Every day | ORAL | 2 refills | Status: DC
  Filled 2023-12-30: qty 90, 90d supply, fill #0
  Filled 2024-03-31: qty 90, 90d supply, fill #1
  Filled 2024-06-30: qty 90, 90d supply, fill #2

## 2023-12-30 MED ORDER — PANTOPRAZOLE SODIUM 40 MG PO TBEC
40.0000 mg | DELAYED_RELEASE_TABLET | Freq: Every day | ORAL | 2 refills | Status: DC
  Filled 2023-12-30: qty 90, 90d supply, fill #0
  Filled 2024-03-31: qty 90, 90d supply, fill #1
  Filled 2024-06-30: qty 90, 90d supply, fill #2

## 2023-12-30 MED ORDER — LISINOPRIL 40 MG PO TABS
40.0000 mg | ORAL_TABLET | Freq: Every day | ORAL | 2 refills | Status: DC
  Filled 2023-12-30: qty 90, 90d supply, fill #0
  Filled 2024-03-31: qty 90, 90d supply, fill #1
  Filled 2024-06-30: qty 90, 90d supply, fill #2

## 2024-01-20 ENCOUNTER — Other Ambulatory Visit (HOSPITAL_BASED_OUTPATIENT_CLINIC_OR_DEPARTMENT_OTHER): Payer: Self-pay

## 2024-01-20 ENCOUNTER — Encounter: Payer: Self-pay | Admitting: Sports Medicine

## 2024-01-20 ENCOUNTER — Ambulatory Visit (INDEPENDENT_AMBULATORY_CARE_PROVIDER_SITE_OTHER): Payer: 59 | Admitting: Sports Medicine

## 2024-01-20 ENCOUNTER — Other Ambulatory Visit: Payer: Self-pay

## 2024-01-20 VITALS — BP 129/75 | HR 76 | Temp 98.1°F | Resp 16 | Ht 70.0 in | Wt 239.0 lb

## 2024-01-20 DIAGNOSIS — N139 Obstructive and reflux uropathy, unspecified: Secondary | ICD-10-CM

## 2024-01-20 DIAGNOSIS — E785 Hyperlipidemia, unspecified: Secondary | ICD-10-CM | POA: Diagnosis not present

## 2024-01-20 DIAGNOSIS — Z1211 Encounter for screening for malignant neoplasm of colon: Secondary | ICD-10-CM | POA: Diagnosis not present

## 2024-01-20 DIAGNOSIS — Z0000 Encounter for general adult medical examination without abnormal findings: Secondary | ICD-10-CM

## 2024-01-20 DIAGNOSIS — E291 Testicular hypofunction: Secondary | ICD-10-CM | POA: Diagnosis not present

## 2024-01-20 DIAGNOSIS — N529 Male erectile dysfunction, unspecified: Secondary | ICD-10-CM | POA: Diagnosis not present

## 2024-01-20 DIAGNOSIS — I10 Essential (primary) hypertension: Secondary | ICD-10-CM

## 2024-01-20 DIAGNOSIS — E669 Obesity, unspecified: Secondary | ICD-10-CM

## 2024-01-20 MED ORDER — TADALAFIL 5 MG PO TABS
5.0000 mg | ORAL_TABLET | Freq: Two times a day (BID) | ORAL | 3 refills | Status: AC
  Filled 2024-01-20 – 2024-02-06 (×2): qty 60, 30d supply, fill #0
  Filled 2024-03-17: qty 60, 30d supply, fill #1
  Filled 2024-04-12: qty 60, 30d supply, fill #2
  Filled 2024-05-27: qty 60, 30d supply, fill #3
  Filled 2024-06-30: qty 60, 30d supply, fill #4
  Filled 2024-08-10: qty 60, 30d supply, fill #5
  Filled 2024-09-14: qty 60, 30d supply, fill #6
  Filled 2024-10-30: qty 60, 30d supply, fill #7
  Filled 2024-12-06: qty 60, 30d supply, fill #8
  Filled 2024-12-31: qty 60, 30d supply, fill #9

## 2024-01-20 NOTE — Assessment & Plan Note (Signed)
 Well controlled, no changes

## 2024-01-20 NOTE — Assessment & Plan Note (Signed)
 Doing better with tadalafil, refilling.

## 2024-01-20 NOTE — Assessment & Plan Note (Addendum)
 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 Fasting annual physical as above. Never got his Cologuard, ordering again.

## 2024-01-20 NOTE — Assessment & Plan Note (Addendum)
 Patient requests cortisol levels. We did discuss the minimal utility with random cortisol levels. I am happy to check it for academic purposes. If it is elevated he will need a 24-hour urinary cortisol collection. Angus will continue to work on diet and exercise, we did discuss GLP-1 treatments, we can visit revisit this at a future visit if need be.

## 2024-01-20 NOTE — Progress Notes (Signed)
 Subjective:    CC: Annual Physical Exam  HPI:  This patient is here for their annual physical  I reviewed the past medical history, family history, social history, surgical history, and allergies today and no changes were needed.  Please see the problem list section below in epic for further details.  Past Medical History: Past Medical History:  Diagnosis Date   Hypertension    Obesity    Past Surgical History: Past Surgical History:  Procedure Laterality Date   ADENOIDECTOMY     TONSILLECTOMY     VASECTOMY     Social History: Social History   Socioeconomic History   Marital status: Divorced    Spouse name: Not on file   Number of children: Not on file   Years of education: Not on file   Highest education level: Not on file  Occupational History   Not on file  Tobacco Use   Smoking status: Never   Smokeless tobacco: Current    Types: Chew  Substance and Sexual Activity   Alcohol use: Yes    Alcohol/week: 2.0 standard drinks of alcohol    Types: 2 drink(s) per week    Comment: daily   Drug use: No   Sexual activity: Not on file  Other Topics Concern   Not on file  Social History Narrative   Not on file   Social Drivers of Health   Financial Resource Strain: Not on file  Food Insecurity: Not on file  Transportation Needs: Not on file  Physical Activity: Not on file  Stress: Not on file  Social Connections: Not on file   Family History: Family History  Problem Relation Age of Onset   Depression Mother    Alcohol abuse Mother    Hyperlipidemia Father    Hypertension Father    Alcohol abuse Father    Depression Father    Heart attack Paternal Grandfather 33   Cancer Maternal Grandfather        prostate and bladder CA    Allergies: No Known Allergies Medications: See med rec.  Review of Systems: No headache, visual changes, nausea, vomiting, diarrhea, constipation, dizziness, abdominal pain, skin rash, fevers, chills, night sweats, swollen lymph  nodes, weight loss, chest pain, body aches, joint swelling, muscle aches, shortness of breath, mood changes, visual or auditory hallucinations.  Objective:    General: Well Developed, well nourished, and in no acute distress.  Neuro: Alert and oriented x3, extra-ocular muscles intact, sensation grossly intact. Cranial nerves II through XII are intact, motor, sensory, and coordinative functions are all intact. HEENT: Normocephalic, atraumatic, pupils equal round reactive to light, neck supple, no masses, no lymphadenopathy, thyroid nonpalpable. Oropharynx, nasopharynx, external ear canals are unremarkable. Skin: Warm and dry, no rashes noted.  Cardiac: Regular rate and rhythm, no murmurs rubs or gallops.  Respiratory: Clear to auscultation bilaterally. Not using accessory muscles, speaking in full sentences.  Abdominal: Soft, nontender, nondistended, positive bowel sounds, no masses, no organomegaly.  Musculoskeletal: Shoulder, elbow, wrist, hip, knee, ankle stable, and with full range of motion.  Impression and Recommendations:    The patient was counselled, risk factors were discussed, anticipatory guidance given.  Obstructive uropathy Doing better with tadalafil, refilling.  Obesity Patient requests cortisol levels. We did discuss the minimal utility with random cortisol levels. I am happy to check it for academic purposes. If it is elevated he will need a 24-hour urinary cortisol collection. Nakota will continue to work on diet and exercise, we did discuss GLP-1 treatments,  we can visit revisit this at a future visit if need be.  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 Fasting annual physical as above. Never got his Cologuard, ordering again.  Erectile dysfunction Doing well, also noticing benefits and obstructive uropathy  symptoms. He does need more, we will do enough tadalafil for him to take twice a day.  Essential hypertension, benign Well-controlled, no changes   ____________________________________________ Ihor Austin. Benjamin Stain, M.D., ABFM., CAQSM., AME. Primary Care and Sports Medicine [facility]  Adjunct Professor of Family Medicine  [employer] [facility] [employer] [facility] [employer]  Restaurant manager, fast food

## 2024-01-20 NOTE — Assessment & Plan Note (Signed)
 Doing well, also noticing benefits and obstructive uropathy symptoms. He does need more, we will do enough tadalafil for him to take twice a day.

## 2024-01-26 LAB — CMP14+EGFR
ALT: 33 IU/L (ref 0–44)
AST: 19 IU/L (ref 0–40)
Albumin: 4.2 g/dL (ref 3.8–4.9)
Alkaline Phosphatase: 80 IU/L (ref 44–121)
BUN/Creatinine Ratio: 15 (ref 9–20)
BUN: 18 mg/dL (ref 6–24)
Bilirubin Total: 0.6 mg/dL (ref 0.0–1.2)
CO2: 22 mmol/L (ref 20–29)
Calcium: 9.5 mg/dL (ref 8.7–10.2)
Chloride: 102 mmol/L (ref 96–106)
Creatinine, Ser: 1.21 mg/dL (ref 0.76–1.27)
Globulin, Total: 2.5 g/dL (ref 1.5–4.5)
Glucose: 101 mg/dL — ABNORMAL HIGH (ref 70–99)
Potassium: 4.2 mmol/L (ref 3.5–5.2)
Sodium: 135 mmol/L (ref 134–144)
Total Protein: 6.7 g/dL (ref 6.0–8.5)
eGFR: 72 mL/min/{1.73_m2} (ref >59)

## 2024-01-26 LAB — LIPID PANEL
Chol/HDL Ratio: 3 ratio (ref 0.0–5.0)
Cholesterol, Total: 186 mg/dL (ref 100–199)
HDL: 62 mg/dL (ref >39)
LDL Chol Calc (NIH): 102 mg/dL — ABNORMAL HIGH (ref 0–99)
Triglycerides: 128 mg/dL (ref 0–149)
VLDL Cholesterol Cal: 22 mg/dL (ref 5–40)

## 2024-01-26 LAB — CBC WITH DIFFERENTIAL/PLATELET
Basophils Absolute: 0 10*3/uL (ref 0.0–0.2)
Basos: 1 %
EOS (ABSOLUTE): 0.1 10*3/uL (ref 0.0–0.4)
Eos: 2 %
Hematocrit: 48.2 % (ref 37.5–51.0)
Hemoglobin: 16 g/dL (ref 13.0–17.7)
Immature Grans (Abs): 0 10*3/uL (ref 0.0–0.1)
Immature Granulocytes: 1 %
Lymphocytes Absolute: 1.5 10*3/uL (ref 0.7–3.1)
Lymphs: 30 %
MCH: 29.4 pg (ref 26.6–33.0)
MCHC: 33.2 g/dL (ref 31.5–35.7)
MCV: 89 fL (ref 79–97)
Monocytes Absolute: 0.7 10*3/uL (ref 0.1–0.9)
Monocytes: 14 %
Neutrophils Absolute: 2.5 10*3/uL (ref 1.4–7.0)
Neutrophils: 52 %
Platelets: 248 10*3/uL (ref 150–450)
RBC: 5.44 x10E6/uL (ref 4.14–5.80)
RDW: 12.5 % (ref 11.6–15.4)
WBC: 4.9 10*3/uL (ref 3.4–10.8)

## 2024-01-26 LAB — HEMOGLOBIN A1C
Est. average glucose Bld gHb Est-mCnc: 111 mg/dL
Hgb A1c MFr Bld: 5.5 % (ref 4.8–5.6)

## 2024-01-26 LAB — TESTOSTERONE, FREE, TOTAL, SHBG
Sex Hormone Binding: 30.7 nmol/L (ref 19.3–76.4)
Testosterone, Free: 14 pg/mL (ref 7.2–24.0)
Testosterone: 402 ng/dL (ref 264–916)

## 2024-01-26 LAB — CORTISOL, FREE: Cortisol, Free Dialysis, LCMS: 0.33 ug/dL

## 2024-01-26 LAB — PSA, TOTAL AND FREE
PSA, Free Pct: 24.5 %
PSA, Free: 0.49 ng/mL
Prostate Specific Ag, Serum: 2 ng/mL (ref 0.0–4.0)

## 2024-01-31 ENCOUNTER — Other Ambulatory Visit (HOSPITAL_BASED_OUTPATIENT_CLINIC_OR_DEPARTMENT_OTHER): Payer: Self-pay

## 2024-02-06 ENCOUNTER — Other Ambulatory Visit (HOSPITAL_BASED_OUTPATIENT_CLINIC_OR_DEPARTMENT_OTHER): Payer: Self-pay

## 2024-03-17 ENCOUNTER — Other Ambulatory Visit (HOSPITAL_BASED_OUTPATIENT_CLINIC_OR_DEPARTMENT_OTHER): Payer: Self-pay

## 2024-04-13 ENCOUNTER — Other Ambulatory Visit (HOSPITAL_BASED_OUTPATIENT_CLINIC_OR_DEPARTMENT_OTHER): Payer: Self-pay

## 2024-05-27 ENCOUNTER — Other Ambulatory Visit (HOSPITAL_BASED_OUTPATIENT_CLINIC_OR_DEPARTMENT_OTHER): Payer: Self-pay

## 2024-07-01 ENCOUNTER — Other Ambulatory Visit (HOSPITAL_BASED_OUTPATIENT_CLINIC_OR_DEPARTMENT_OTHER): Payer: Self-pay

## 2024-07-28 ENCOUNTER — Encounter: Payer: Self-pay | Admitting: Sports Medicine

## 2024-08-10 ENCOUNTER — Other Ambulatory Visit (HOSPITAL_BASED_OUTPATIENT_CLINIC_OR_DEPARTMENT_OTHER): Payer: Self-pay

## 2024-09-15 ENCOUNTER — Other Ambulatory Visit (HOSPITAL_BASED_OUTPATIENT_CLINIC_OR_DEPARTMENT_OTHER): Payer: Self-pay

## 2024-09-16 ENCOUNTER — Other Ambulatory Visit (HOSPITAL_BASED_OUTPATIENT_CLINIC_OR_DEPARTMENT_OTHER): Payer: Self-pay

## 2024-10-02 ENCOUNTER — Other Ambulatory Visit: Payer: Self-pay

## 2024-10-06 ENCOUNTER — Other Ambulatory Visit (HOSPITAL_BASED_OUTPATIENT_CLINIC_OR_DEPARTMENT_OTHER): Payer: Self-pay

## 2024-10-06 ENCOUNTER — Other Ambulatory Visit: Payer: Self-pay | Admitting: Urgent Care

## 2024-10-06 DIAGNOSIS — K2100 Gastro-esophageal reflux disease with esophagitis, without bleeding: Secondary | ICD-10-CM

## 2024-10-06 DIAGNOSIS — E785 Hyperlipidemia, unspecified: Secondary | ICD-10-CM

## 2024-10-06 DIAGNOSIS — I10 Essential (primary) hypertension: Secondary | ICD-10-CM

## 2024-10-06 NOTE — Telephone Encounter (Unsigned)
 Copied from CRM [id number]. Topic: Clinical - Medication Refill >> Oct 06, 2024  4:08 PM Kendralyn S wrote: Medication: atorvastatin  (LIPITOR) 10 MG tablet lisinopril  (ZESTRIL ) 40 MG tablet pantoprazole  (PROTONIX ) 40 MG table   Has the patient contacted their pharmacy? Yes (Agent: If no, request that the patient contact the pharmacy for the refill. If patient does not wish to contact the pharmacy document the reason why and proceed with request.) (Agent: If yes, when and what did the pharmacy advise?)  This is the patient's preferred pharmacy:   [facility] [address] [place] [address] Phone: [phone] Fax: [phone]    Is this the correct pharmacy for this prescription? Yes If no, delete pharmacy and type the correct one.   Has the prescription been filled recently? No  Is the patient out of the medication? No  Has the patient been seen for an appointment in the last year OR does the patient have an upcoming appointment? Yes  Can we respond through MyChart? Yes  Agent: Please be advised that Rx refills may take up to 3 business days. We ask that you follow-up with your pharmacy.

## 2024-10-07 ENCOUNTER — Other Ambulatory Visit (HOSPITAL_BASED_OUTPATIENT_CLINIC_OR_DEPARTMENT_OTHER): Payer: Self-pay

## 2024-10-07 ENCOUNTER — Telehealth: Payer: Self-pay

## 2024-10-07 MED ORDER — PANTOPRAZOLE SODIUM 40 MG PO TBEC
40.0000 mg | DELAYED_RELEASE_TABLET | Freq: Every day | ORAL | 2 refills | Status: AC
  Filled 2024-10-07: qty 90, 90d supply, fill #0
  Filled 2024-12-31: qty 90, 90d supply, fill #1

## 2024-10-07 MED ORDER — ATORVASTATIN CALCIUM 10 MG PO TABS
10.0000 mg | ORAL_TABLET | Freq: Every day | ORAL | 2 refills | Status: AC
  Filled 2024-10-07: qty 90, 90d supply, fill #0
  Filled 2024-12-31: qty 90, 90d supply, fill #1

## 2024-10-07 MED ORDER — LISINOPRIL 40 MG PO TABS
40.0000 mg | ORAL_TABLET | Freq: Every day | ORAL | 2 refills | Status: AC
  Filled 2024-10-07: qty 90, 90d supply, fill #0
  Filled 2024-12-31: qty 90, 90d supply, fill #1

## 2024-10-07 NOTE — Telephone Encounter (Signed)
 Copied from CRM [id number]. Topic: Appointments - Transfer of Care >> Oct 06, 2024  4:07 PM Antony RAMAN wrote: Pt is requesting to transfer FROM: Thekkekandam Pt is requesting to transfer TO: crain Reason for requested transfer: his pcp left the office It is the responsibility of the team the patient would like to transfer to (Dr. crain) to reach out to the patient if for any reason this transfer is not acceptable.

## 2024-10-07 NOTE — Telephone Encounter (Signed)
 Patient scheduled.

## 2024-10-07 NOTE — Telephone Encounter (Signed)
 Patient requesting rx rf of   Atorvstatin 10mg   Last wrtten 12/30/2023 Lisinopril  40mg  Last written 12/30/2023 Pantoprazole   40mg   Last written 02/032025 Last OV 01/20/2024 Last lipid panel 12/31/2023 Upcoming appt = TOC to whintey crain  11/25/2024

## 2024-11-02 ENCOUNTER — Other Ambulatory Visit (HOSPITAL_BASED_OUTPATIENT_CLINIC_OR_DEPARTMENT_OTHER): Payer: Self-pay

## 2024-11-25 ENCOUNTER — Encounter: Admitting: Urgent Care

## 2024-12-09 ENCOUNTER — Other Ambulatory Visit (HOSPITAL_BASED_OUTPATIENT_CLINIC_OR_DEPARTMENT_OTHER): Payer: Self-pay

## 2024-12-28 ENCOUNTER — Ambulatory Visit: Admitting: Urgent Care

## 2025-01-01 ENCOUNTER — Other Ambulatory Visit (HOSPITAL_BASED_OUTPATIENT_CLINIC_OR_DEPARTMENT_OTHER): Payer: Self-pay

## 2025-01-20 ENCOUNTER — Encounter: Payer: 59 | Admitting: Sports Medicine
